# Patient Record
Sex: Male | Born: 1935 | Race: White | Hispanic: No | Marital: Single | Smoking: Former smoker
Health system: Southern US, Community
[De-identification: ages and names within clinical notes are randomized; demographics above are authoritative.]

## PROBLEM LIST (undated history)

## (undated) DIAGNOSIS — N400 Enlarged prostate without lower urinary tract symptoms: Secondary | ICD-10-CM

## (undated) HISTORY — DX: Benign prostatic hyperplasia without lower urinary tract symptoms: N40.0

---

## 2008-06-18 ENCOUNTER — Ambulatory Visit: Payer: Self-pay | Admitting: Internal Medicine

## 2008-06-18 DIAGNOSIS — J82 Pulmonary eosinophilia, not elsewhere classified: Secondary | ICD-10-CM

## 2008-06-18 DIAGNOSIS — J8289 Other pulmonary eosinophilia, not elsewhere classified: Secondary | ICD-10-CM

## 2008-06-18 HISTORY — DX: Other pulmonary eosinophilia, not elsewhere classified: J82.89

## 2008-06-19 LAB — CONVERTED CEMR LAB
Basophils Absolute: 0.1 10*3/uL (ref 0.0–0.1)
Basophils Relative: 1 % (ref 0.0–3.0)
Hemoglobin: 12.6 g/dL — ABNORMAL LOW (ref 13.0–17.0)
Lymphocytes Relative: 19 % (ref 12.0–46.0)
MCHC: 34.6 g/dL (ref 30.0–36.0)
Monocytes Relative: 8.3 % (ref 3.0–12.0)
Neutrophils Relative %: 57.6 % (ref 43.0–77.0)
RBC: 4.36 M/uL (ref 4.22–5.81)
Sed Rate: 48 mm/hr — ABNORMAL HIGH (ref 0–16)

## 2008-07-03 ENCOUNTER — Ambulatory Visit: Payer: Self-pay | Admitting: Internal Medicine

## 2009-12-30 IMAGING — CR DG CHEST 2V
3 series · 3 of 3 positions shown · non-contrast
Comparison: None

CLINICAL DATA: Pneumonia, follow-up, former smoker

CHEST - 2 VIEW

[view not recorded (1 of 3)]
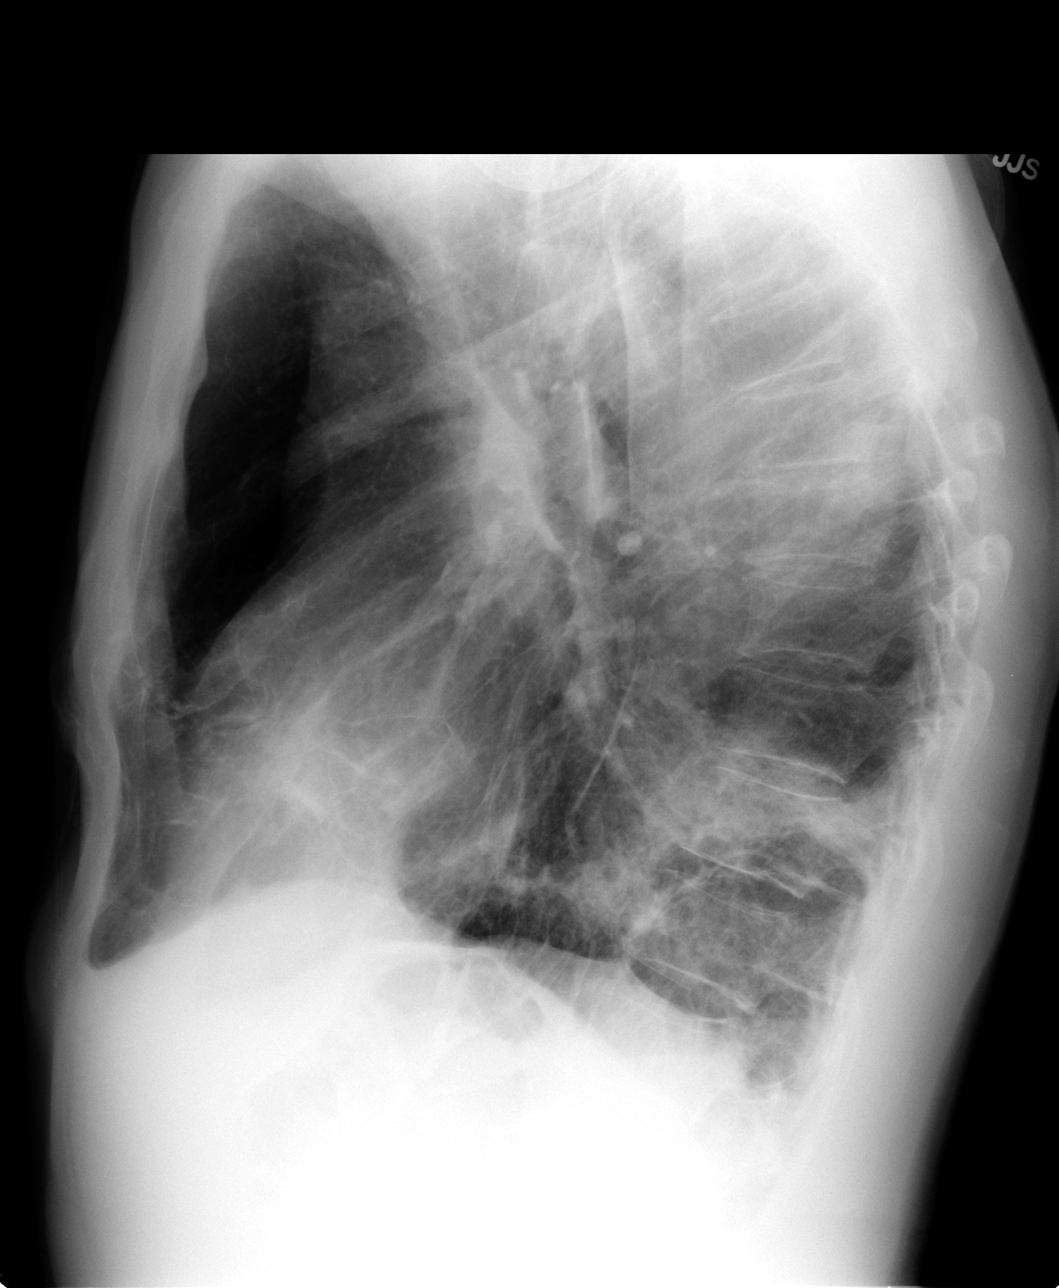

[view not recorded (2 of 3)]
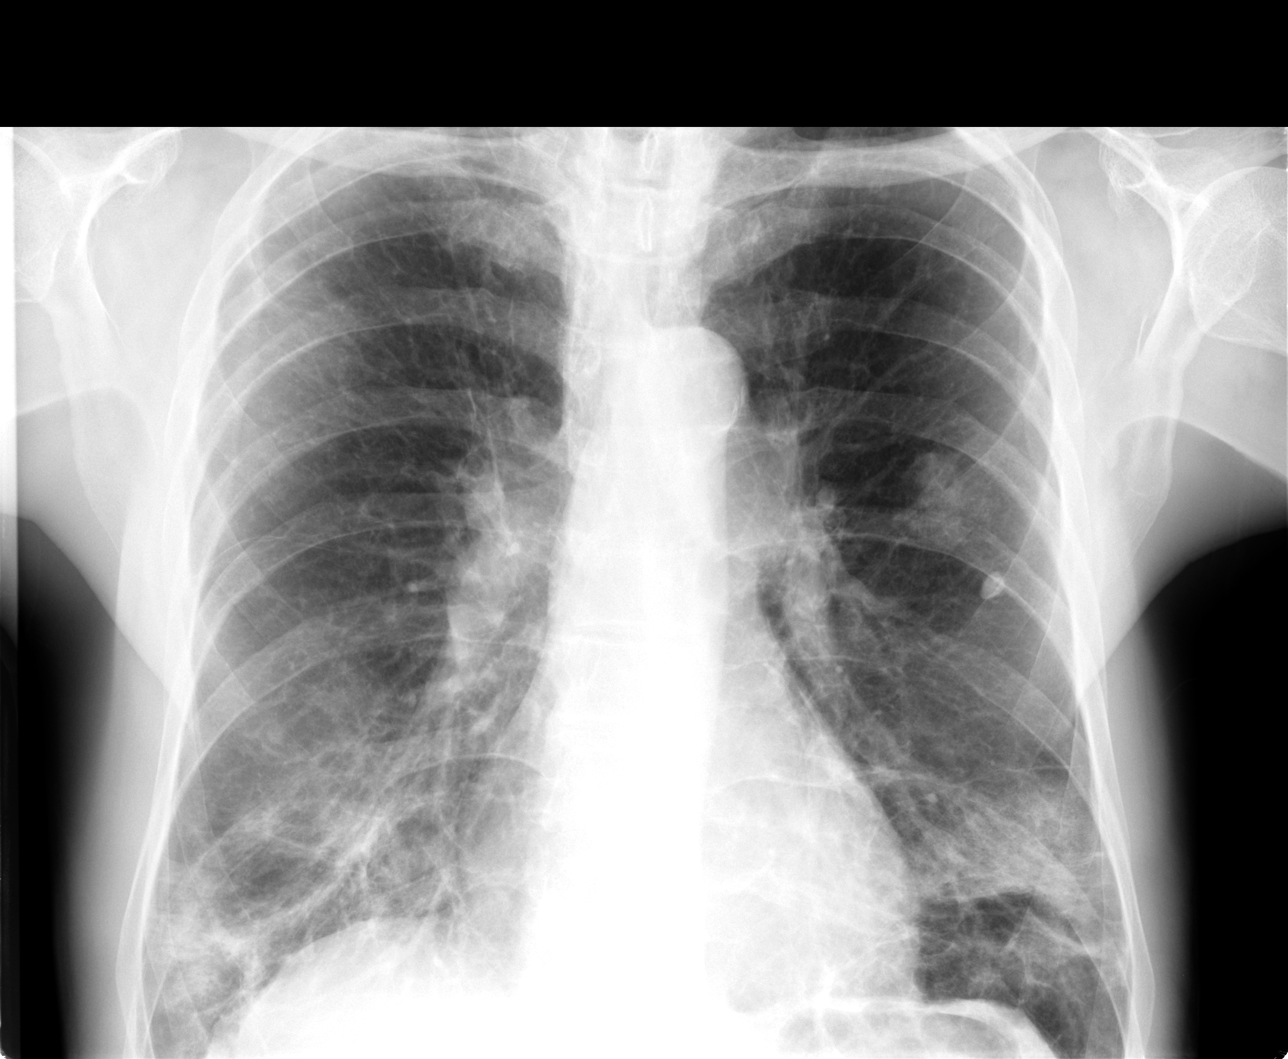

[view not recorded (3 of 3)]
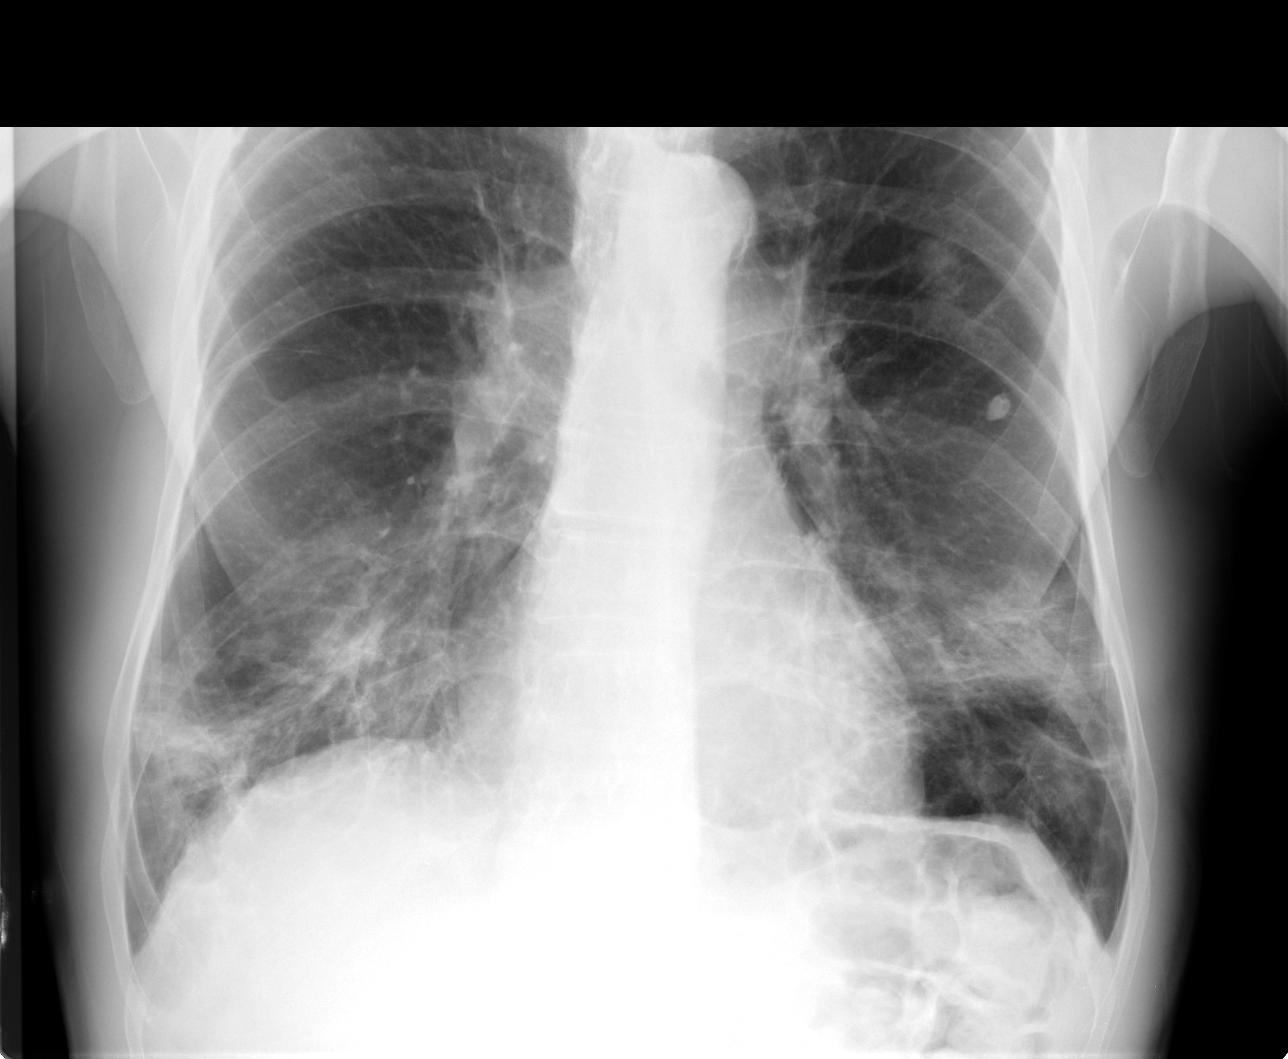

[3 of 3 positions shown; findings below may reference images not displayed]

FINDINGS: The lungs are hyperaerated consistent with COPD.  There
are patchy opacities at both lung bases involving the lower lobe,
right mid lobe and possibly lingula consistent with pneumonia.
Patchy opacity is also noted in the superior segment of the left
lower lobe most consistent with pneumonia.  Follow-up chest x-ray
is recommended to ensure clearing.  No effusion is seen.  The heart
is within normal limits in size.  Left lower lobe granuloma is
noted.
IMPRESSION: 1.  Patchy opacities particularly in the lower lobes but also in
the right middle lobe and/or lingular consistent with pneumonia.
2. COPD.

## 2011-09-22 DIAGNOSIS — I679 Cerebrovascular disease, unspecified: Secondary | ICD-10-CM | POA: Diagnosis not present

## 2011-09-22 DIAGNOSIS — I69998 Other sequelae following unspecified cerebrovascular disease: Secondary | ICD-10-CM | POA: Diagnosis not present

## 2011-09-22 DIAGNOSIS — I6990 Unspecified sequelae of unspecified cerebrovascular disease: Secondary | ICD-10-CM | POA: Diagnosis not present

## 2012-09-02 DIAGNOSIS — M5430 Sciatica, unspecified side: Secondary | ICD-10-CM | POA: Diagnosis not present

## 2014-02-24 DIAGNOSIS — C44529 Squamous cell carcinoma of skin of other part of trunk: Secondary | ICD-10-CM | POA: Diagnosis not present

## 2014-02-24 DIAGNOSIS — D0460 Carcinoma in situ of skin of unspecified upper limb, including shoulder: Secondary | ICD-10-CM | POA: Diagnosis not present

## 2014-02-24 DIAGNOSIS — L570 Actinic keratosis: Secondary | ICD-10-CM | POA: Diagnosis not present

## 2014-03-05 DIAGNOSIS — D0462 Carcinoma in situ of skin of left upper limb, including shoulder: Secondary | ICD-10-CM | POA: Diagnosis not present

## 2014-10-27 DIAGNOSIS — L570 Actinic keratosis: Secondary | ICD-10-CM | POA: Diagnosis not present

## 2015-02-23 DIAGNOSIS — M5410 Radiculopathy, site unspecified: Secondary | ICD-10-CM | POA: Diagnosis not present

## 2015-02-23 DIAGNOSIS — M791 Myalgia: Secondary | ICD-10-CM | POA: Diagnosis not present

## 2015-03-11 DIAGNOSIS — Z2821 Immunization not carried out because of patient refusal: Secondary | ICD-10-CM | POA: Diagnosis not present

## 2015-03-11 DIAGNOSIS — M25551 Pain in right hip: Secondary | ICD-10-CM | POA: Diagnosis not present

## 2015-03-13 DIAGNOSIS — M5416 Radiculopathy, lumbar region: Secondary | ICD-10-CM | POA: Diagnosis not present

## 2015-03-13 DIAGNOSIS — M5137 Other intervertebral disc degeneration, lumbosacral region: Secondary | ICD-10-CM | POA: Diagnosis not present

## 2015-03-13 DIAGNOSIS — M545 Low back pain: Secondary | ICD-10-CM | POA: Diagnosis not present

## 2015-03-31 DIAGNOSIS — M25552 Pain in left hip: Secondary | ICD-10-CM | POA: Diagnosis not present

## 2015-03-31 DIAGNOSIS — M5432 Sciatica, left side: Secondary | ICD-10-CM | POA: Diagnosis not present

## 2015-03-31 DIAGNOSIS — M5417 Radiculopathy, lumbosacral region: Secondary | ICD-10-CM | POA: Diagnosis not present

## 2015-04-02 DIAGNOSIS — M5417 Radiculopathy, lumbosacral region: Secondary | ICD-10-CM | POA: Diagnosis not present

## 2015-04-02 DIAGNOSIS — M5432 Sciatica, left side: Secondary | ICD-10-CM | POA: Diagnosis not present

## 2015-04-02 DIAGNOSIS — M25552 Pain in left hip: Secondary | ICD-10-CM | POA: Diagnosis not present

## 2015-04-05 DIAGNOSIS — M25552 Pain in left hip: Secondary | ICD-10-CM | POA: Diagnosis not present

## 2015-04-05 DIAGNOSIS — M5417 Radiculopathy, lumbosacral region: Secondary | ICD-10-CM | POA: Diagnosis not present

## 2015-04-05 DIAGNOSIS — M5432 Sciatica, left side: Secondary | ICD-10-CM | POA: Diagnosis not present

## 2015-04-16 DIAGNOSIS — M5136 Other intervertebral disc degeneration, lumbar region: Secondary | ICD-10-CM | POA: Diagnosis not present

## 2015-04-16 DIAGNOSIS — M5416 Radiculopathy, lumbar region: Secondary | ICD-10-CM | POA: Diagnosis not present

## 2015-04-16 DIAGNOSIS — M5117 Intervertebral disc disorders with radiculopathy, lumbosacral region: Secondary | ICD-10-CM | POA: Diagnosis not present

## 2016-04-29 DIAGNOSIS — J209 Acute bronchitis, unspecified: Secondary | ICD-10-CM | POA: Diagnosis not present

## 2016-04-29 DIAGNOSIS — J069 Acute upper respiratory infection, unspecified: Secondary | ICD-10-CM | POA: Diagnosis not present

## 2016-05-05 DIAGNOSIS — J22 Unspecified acute lower respiratory infection: Secondary | ICD-10-CM | POA: Diagnosis not present

## 2016-09-11 DIAGNOSIS — E785 Hyperlipidemia, unspecified: Secondary | ICD-10-CM | POA: Diagnosis not present

## 2016-09-11 DIAGNOSIS — Z1389 Encounter for screening for other disorder: Secondary | ICD-10-CM | POA: Diagnosis not present

## 2016-09-11 DIAGNOSIS — Z6825 Body mass index (BMI) 25.0-25.9, adult: Secondary | ICD-10-CM | POA: Diagnosis not present

## 2016-09-11 DIAGNOSIS — Z9181 History of falling: Secondary | ICD-10-CM | POA: Diagnosis not present

## 2016-09-11 DIAGNOSIS — R635 Abnormal weight gain: Secondary | ICD-10-CM | POA: Diagnosis not present

## 2016-09-19 DIAGNOSIS — D539 Nutritional anemia, unspecified: Secondary | ICD-10-CM | POA: Diagnosis not present

## 2016-09-19 DIAGNOSIS — D649 Anemia, unspecified: Secondary | ICD-10-CM | POA: Diagnosis not present

## 2017-07-25 DIAGNOSIS — L820 Inflamed seborrheic keratosis: Secondary | ICD-10-CM | POA: Diagnosis not present

## 2017-07-25 DIAGNOSIS — C44622 Squamous cell carcinoma of skin of right upper limb, including shoulder: Secondary | ICD-10-CM | POA: Diagnosis not present

## 2017-07-25 DIAGNOSIS — L821 Other seborrheic keratosis: Secondary | ICD-10-CM | POA: Diagnosis not present

## 2017-07-25 DIAGNOSIS — L578 Other skin changes due to chronic exposure to nonionizing radiation: Secondary | ICD-10-CM | POA: Diagnosis not present

## 2017-07-25 DIAGNOSIS — L570 Actinic keratosis: Secondary | ICD-10-CM | POA: Diagnosis not present

## 2017-08-07 DIAGNOSIS — C44612 Basal cell carcinoma of skin of right upper limb, including shoulder: Secondary | ICD-10-CM | POA: Diagnosis not present

## 2018-02-03 DEATH — deceased

## 2019-09-05 DIAGNOSIS — Z23 Encounter for immunization: Secondary | ICD-10-CM | POA: Diagnosis not present

## 2019-10-03 DIAGNOSIS — Z23 Encounter for immunization: Secondary | ICD-10-CM | POA: Diagnosis not present

## 2019-12-24 DIAGNOSIS — B029 Zoster without complications: Secondary | ICD-10-CM | POA: Diagnosis not present

## 2020-01-02 DIAGNOSIS — R21 Rash and other nonspecific skin eruption: Secondary | ICD-10-CM | POA: Diagnosis not present

## 2020-01-02 DIAGNOSIS — L209 Atopic dermatitis, unspecified: Secondary | ICD-10-CM | POA: Diagnosis not present

## 2020-01-02 DIAGNOSIS — T7840XA Allergy, unspecified, initial encounter: Secondary | ICD-10-CM | POA: Diagnosis not present

## 2020-01-06 DIAGNOSIS — N183 Chronic kidney disease, stage 3 unspecified: Secondary | ICD-10-CM | POA: Diagnosis not present

## 2020-01-06 DIAGNOSIS — B029 Zoster without complications: Secondary | ICD-10-CM | POA: Diagnosis not present

## 2020-01-06 DIAGNOSIS — T7840XD Allergy, unspecified, subsequent encounter: Secondary | ICD-10-CM | POA: Diagnosis not present

## 2020-01-06 DIAGNOSIS — Z1331 Encounter for screening for depression: Secondary | ICD-10-CM | POA: Diagnosis not present

## 2020-01-29 DIAGNOSIS — N183 Chronic kidney disease, stage 3 unspecified: Secondary | ICD-10-CM | POA: Diagnosis not present

## 2020-01-29 DIAGNOSIS — Z13220 Encounter for screening for lipoid disorders: Secondary | ICD-10-CM | POA: Diagnosis not present

## 2020-01-29 DIAGNOSIS — Z79899 Other long term (current) drug therapy: Secondary | ICD-10-CM | POA: Diagnosis not present

## 2020-01-29 DIAGNOSIS — Z0000 Encounter for general adult medical examination without abnormal findings: Secondary | ICD-10-CM | POA: Diagnosis not present

## 2020-01-29 DIAGNOSIS — B0229 Other postherpetic nervous system involvement: Secondary | ICD-10-CM | POA: Diagnosis not present

## 2020-01-29 DIAGNOSIS — E785 Hyperlipidemia, unspecified: Secondary | ICD-10-CM | POA: Diagnosis not present

## 2020-02-19 DIAGNOSIS — L578 Other skin changes due to chronic exposure to nonionizing radiation: Secondary | ICD-10-CM | POA: Diagnosis not present

## 2020-02-19 DIAGNOSIS — L821 Other seborrheic keratosis: Secondary | ICD-10-CM | POA: Diagnosis not present

## 2020-02-19 DIAGNOSIS — L570 Actinic keratosis: Secondary | ICD-10-CM | POA: Diagnosis not present

## 2020-03-24 DIAGNOSIS — B0229 Other postherpetic nervous system involvement: Secondary | ICD-10-CM | POA: Diagnosis not present

## 2020-03-24 DIAGNOSIS — B029 Zoster without complications: Secondary | ICD-10-CM | POA: Diagnosis not present

## 2020-03-24 DIAGNOSIS — N183 Chronic kidney disease, stage 3 unspecified: Secondary | ICD-10-CM | POA: Diagnosis not present

## 2020-03-24 DIAGNOSIS — Z6824 Body mass index (BMI) 24.0-24.9, adult: Secondary | ICD-10-CM | POA: Diagnosis not present

## 2020-06-03 DIAGNOSIS — M542 Cervicalgia: Secondary | ICD-10-CM | POA: Diagnosis not present

## 2020-06-08 DIAGNOSIS — R0982 Postnasal drip: Secondary | ICD-10-CM | POA: Diagnosis not present

## 2020-06-08 DIAGNOSIS — J309 Allergic rhinitis, unspecified: Secondary | ICD-10-CM | POA: Diagnosis not present

## 2020-06-08 DIAGNOSIS — Z6824 Body mass index (BMI) 24.0-24.9, adult: Secondary | ICD-10-CM | POA: Diagnosis not present

## 2020-06-08 DIAGNOSIS — M542 Cervicalgia: Secondary | ICD-10-CM | POA: Diagnosis not present

## 2020-09-21 DIAGNOSIS — I4891 Unspecified atrial fibrillation: Secondary | ICD-10-CM | POA: Diagnosis not present

## 2020-09-21 DIAGNOSIS — Z20828 Contact with and (suspected) exposure to other viral communicable diseases: Secondary | ICD-10-CM | POA: Diagnosis not present

## 2020-09-21 DIAGNOSIS — R519 Headache, unspecified: Secondary | ICD-10-CM | POA: Diagnosis not present

## 2020-09-21 DIAGNOSIS — Z87891 Personal history of nicotine dependence: Secondary | ICD-10-CM | POA: Diagnosis not present

## 2020-09-21 DIAGNOSIS — J449 Chronic obstructive pulmonary disease, unspecified: Secondary | ICD-10-CM | POA: Diagnosis not present

## 2020-09-21 DIAGNOSIS — Z20822 Contact with and (suspected) exposure to covid-19: Secondary | ICD-10-CM | POA: Diagnosis not present

## 2020-09-21 DIAGNOSIS — R0602 Shortness of breath: Secondary | ICD-10-CM | POA: Diagnosis not present

## 2020-09-22 DIAGNOSIS — I4891 Unspecified atrial fibrillation: Secondary | ICD-10-CM | POA: Diagnosis not present

## 2020-10-07 ENCOUNTER — Encounter: Payer: Self-pay | Admitting: Cardiology

## 2020-10-07 ENCOUNTER — Encounter: Payer: Self-pay | Admitting: *Deleted

## 2020-10-07 DIAGNOSIS — N400 Enlarged prostate without lower urinary tract symptoms: Secondary | ICD-10-CM | POA: Insufficient documentation

## 2020-11-07 NOTE — Progress Notes (Signed)
Cardiology Office Note:    Date:  11/08/2020   ID:  Kenneth Bradley, DOB 01/20/1931, MRN [id number]  PCP:  Serita Grammes, MD  Cardiologist:  Shirlee More, MD   Referring MD: Myer Peer, MD  ASSESSMENT:    1. PAF (paroxysmal atrial fibrillation) ([place])   2. Chronic anticoagulation    PLAN:    In order of problems listed above:  1. He remains in atrial fibrillation resume rate suppressing medication once daily beta-blocker he has been off his calcium channel blocker initiate anticoagulant he will stop aspirin check echocardiogram for cardiomyopathy and reassess in a month and consider cardioversion if remains symptomatic 2. Stop aspirin initiate anticoagulant  Next appointment 1 month   Medication Adjustments/Labs and Tests Ordered: Current medicines are reviewed at length with the patient today.  Concerns regarding medicines are outlined above.  Orders Placed This Encounter  Procedures  . EKG 12-Lead  . ECHOCARDIOGRAM COMPLETE   Meds ordered this encounter  Medications  . rivaroxaban (XARELTO) 20 MG TABS tablet    Sig: Take 1 tablet (20 mg total) by mouth daily with supper.    Dispense:  90 tablet    Refill:  3  . metoprolol succinate (TOPROL-XL) 50 MG 24 hr tablet    Sig: Take 1 tablet (50 mg total) by mouth daily. Take with or immediately following a meal.    Dispense:  90 tablet    Refill:  3     No chief complaint on file.   History of Present Illness:    Kenneth Bradley is a 87 y.o. male who is being seen today for the evaluation of atrial fibrillation at the request of Myer Peer, MD.  He was seen [facility] 09/21/2020 for atrial fibrillation. Laboratories reviewed hemoglobin 12.8 platelet count 233,000 high-sensitivity troponin was low at 10 potassium 3.9 sodium 137 creatinine 1.09 GFR greater than 60 cc Chest x-ray showed COPD. EKG showed atrial fibrillation rate 121 bpm. He was treated with Cardizem for rate  control in the emergency room and given a prescription for oral Cardizem.  He was not anticoagulated.  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  He also served in [place]. Presently takes care of his wife who has dementia. Until a few years ago ran every day and did marathons. He remains very active and walks every day. Recently he has noticed his exercise tolerance is diminished and varies day by day and at times his heart rate is rapid. He is no longer taking a calcium channel blocker. He has no history of heart disease congenital rheumatic does not have edema shortness of breath chest pain or syncope.  We discussed his stroke risk and he wants to take an anticoagulant will like 1 a day medications and will put him on Xarelto.  Also will resume rate slowing beta-blocker Toprol Past Medical History:  Diagnosis Date  . BPH (benign prostatic hyperplasia)   . PULMONARY INFILTRATE INCLUDES (EOSINOPHILIA) 06/18/2008   Qualifier: Diagnosis of  By: Melvyn Novas MD, Christena Deem     Past Surgical History:  Procedure Laterality Date  . VENTRAL HERNIA REPAIR  1977    Current Medications: Current Meds  Medication Sig  . metoprolol succinate (TOPROL-XL) 50 MG 24 hr tablet Take 1 tablet (50 mg total) by mouth daily. Take with or immediately following a meal.  . rivaroxaban (XARELTO) 20 MG TABS tablet Take 1 tablet (20 mg total) by mouth daily  with supper.     Allergies:   Ciprofloxacin and Valacyclovir   Social History   Socioeconomic History  . Marital status: Single    Spouse name: Not on file  . Number of children: Not on file  . Years of education: Not on file  . Highest education level: Not on file  Occupational History  . Not on file  Tobacco Use  . Smoking status: Former Smoker    Types: Cigarettes  . Smokeless tobacco: Never Used  Substance and Sexual Activity  . Alcohol use: Yes    Comment: Rare, special occasion  . Drug use: Never  . Sexual activity: Not on file   Other Topics Concern  . Not on file  Social History Narrative  . Not on file   Social Determinants of Health   Financial Resource Strain: Not on file  Food Insecurity: Not on file  Transportation Needs: Not on file  Physical Activity: Not on file  Stress: Not on file  Social Connections: Not on file     Family History: The patient's family history includes Diabetes in his father; Heart Problems in his father.  ROS:   ROS Please see the history of present illness.     All other systems reviewed and are negative.  EKGs/Labs/Other Studies Reviewed:    The following studies were reviewed today:   EKG:  EKG is  ordered today.  The ekg ordered today is personally reviewed and demonstrates atrial fibrillation controlled ventricular range  Recent Labs: No results found for requested labs within last 8760 hours.  Recent Lipid Panel 01/29/2020: Cholesterol 206 LDL 126 triglycerides 64 HDL 68  Physical Exam:    VS:  BP 130/78 (BP Location: Right Arm, Patient Position: Sitting, Cuff Size: Normal)   Pulse (!) 106   Ht 6\' 4"  (1.93 m)   Wt 185 lb 12.8 oz (84.3 kg)   SpO2 99%   BMI 22.62 kg/m     Wt Readings from Last 3 Encounters:  11/08/20 185 lb 12.8 oz (84.3 kg)  09/21/20 181 lb (82.1 kg)     GEN: He appears younger than his age well nourished, well developed in no acute distress HEENT: Normal NECK: No JVD; No carotid bruits LYMPHATICS: No lymphadenopathy CARDIAC: Irregular rhythm variable first heart sound  no murmurs, rubs, gallops RESPIRATORY:  Clear to auscultation without rales, wheezing or rhonchi  ABDOMEN: Soft, non-tender, non-distended MUSCULOSKELETAL:  No edema; No deformity  SKIN: Warm and dry NEUROLOGIC:  Alert and oriented x 3 PSYCHIATRIC:  Normal affect     Signed, Shirlee More, MD  11/08/2020 2:00 PM    [place]

## 2020-11-08 ENCOUNTER — Encounter: Payer: Self-pay | Admitting: Cardiology

## 2020-11-08 ENCOUNTER — Other Ambulatory Visit: Payer: Self-pay

## 2020-11-08 ENCOUNTER — Ambulatory Visit (INDEPENDENT_AMBULATORY_CARE_PROVIDER_SITE_OTHER): Payer: Medicare Other | Admitting: Cardiology

## 2020-11-08 ENCOUNTER — Telehealth: Payer: Self-pay | Admitting: Cardiology

## 2020-11-08 VITALS — BP 130/78 | HR 106 | Ht 76.0 in | Wt 185.8 lb

## 2020-11-08 DIAGNOSIS — Z7901 Long term (current) use of anticoagulants: Secondary | ICD-10-CM | POA: Diagnosis not present

## 2020-11-08 DIAGNOSIS — I480 Paroxysmal atrial fibrillation: Secondary | ICD-10-CM | POA: Diagnosis not present

## 2020-11-08 MED ORDER — METOPROLOL SUCCINATE ER 50 MG PO TB24: 50.0000 mg | ORAL_TABLET | Freq: Every day | ORAL | 3 refills | Status: DC

## 2020-11-08 MED ORDER — RIVAROXABAN 20 MG PO TABS: 20.0000 mg | ORAL_TABLET | Freq: Every day | ORAL | 3 refills | Status: DC

## 2020-11-08 MED ORDER — DILTIAZEM HCL ER BEADS 180 MG PO CP24: 180.0000 mg | ORAL_CAPSULE | Freq: Every day | ORAL | 3 refills | Status: DC

## 2020-11-08 NOTE — Patient Instructions (Signed)
Medication Instructions:  Your physician has recommended you make the following change in your medication:  START: Xarelto 20 mg take one tablet by mouth daily.  START: TOPROL XL 50 mg take one tablet by mouth daily.  *If you need a refill on your cardiac medications before your next appointment, please call your pharmacy*   Lab Work: NOne If you have labs (blood work) drawn today and your tests are completely normal, you will receive your results only by: Marland Kitchen MyChart Message (if you have MyChart) OR . A paper copy in the mail If you have any lab test that is abnormal or we need to change your treatment, we will call you to review the results.   Testing/Procedures: Your physician has requested that you have an echocardiogram. Echocardiography is a painless test that uses sound waves to create images of your heart. It provides your doctor with information about the size and shape of your heart and how well your heart's chambers and valves are working. This procedure takes approximately one hour. There are no restrictions for this procedure.     Follow-Up: At [facility], you and your health needs are our priority.  As part of our continuing mission to provide you with exceptional heart care, we have created designated Provider Care Teams.  These Care Teams include your primary Cardiologist (physician) and Advanced Practice Providers (APPs -  Physician Assistants and Nurse Practitioners) who all work together to provide you with the care you need, when you need it.  We recommend signing up for the patient portal called "MyChart".  Sign up information is provided on this After Visit Summary.  MyChart is used to connect with patients for Virtual Visits (Telemedicine).  Patients are able to view lab/test results, encounter notes, upcoming appointments, etc.  Non-urgent messages can be sent to your provider as well.   To learn more about what you can do with MyChart, go to  [email or web address].    Your next appointment:   1 month(s)  The format for your next appointment:   In Person  Provider:   Shirlee More, MD   Other Instructions

## 2020-11-08 NOTE — Telephone Encounter (Signed)
    Pt c/o medication issue:  1. Name of Medication:   diltiazem (TIAZAC) 180 MG 24 hr capsule   2. How are you currently taking this medication (dosage and times per day)? Take 180 mg by mouth daily.  3. Are you having a reaction (difficulty breathing--STAT)?   4. What is your medication issue? Pt wanted to know if Dr. Bettina Gavia still wants him to be on Diltiazem. Also, [facility] pharmacy don't have [place], he wanted to resend prescription to to [facility] at [address], [place], [place] [address]. Also, he said if Dr. Bettina Gavia wants him to be on diltiazem to send prescription at [facility] as well

## 2020-11-08 NOTE — Telephone Encounter (Signed)
Refills sent in per request 

## 2020-11-09 ENCOUNTER — Telehealth: Payer: Self-pay | Admitting: Cardiology

## 2020-11-09 MED ORDER — DILTIAZEM HCL ER BEADS 180 MG PO CP24: 180.0000 mg | ORAL_CAPSULE | Freq: Every day | ORAL | 3 refills | Status: DC

## 2020-11-09 NOTE — Telephone Encounter (Signed)
Prescription sent in again per request

## 2020-11-09 NOTE — Telephone Encounter (Signed)
Pt c/o medication issue:  1. Name of Medication: diltiazem (TIAZAC) 180 MG 24 hr capsule  2. How are you currently taking this medication (dosage and times per day)?   3. Are you having a reaction (difficulty breathing--STAT)?   4. What is your medication issue? Patient said this medication was on his after visit summary from his appointment yesterday but was not sent to his pharmacy. If the patient still needs to take it, please send the RX to  [facility], [place] - [address] AT [place]

## 2020-11-10 ENCOUNTER — Other Ambulatory Visit: Payer: Self-pay

## 2020-11-10 MED ORDER — DILTIAZEM HCL ER BEADS 180 MG PO CP24: 180.0000 mg | ORAL_CAPSULE | Freq: Every day | ORAL | 3 refills | Status: DC

## 2020-11-10 MED ORDER — RIVAROXABAN 20 MG PO TABS: 20.0000 mg | ORAL_TABLET | Freq: Every day | ORAL | 1 refills | Status: DC

## 2020-11-10 MED ORDER — METOPROLOL SUCCINATE ER 50 MG PO TB24: 50.0000 mg | ORAL_TABLET | Freq: Every day | ORAL | 3 refills | Status: DC

## 2020-11-10 NOTE — Telephone Encounter (Signed)
45m, 84.3kg, scr 1.09 09/21/20, lovw/munley 11/08/20, ccr 56.9

## 2020-11-10 NOTE — Telephone Encounter (Signed)
Refill of Diltiazem 180 mg, Metoprolol Succinate 50 mg, Xarelto 20 mg sent to [facility].

## 2020-11-30 ENCOUNTER — Ambulatory Visit (INDEPENDENT_AMBULATORY_CARE_PROVIDER_SITE_OTHER): Payer: Medicare Other

## 2020-11-30 ENCOUNTER — Other Ambulatory Visit: Payer: Self-pay

## 2020-11-30 DIAGNOSIS — I480 Paroxysmal atrial fibrillation: Secondary | ICD-10-CM

## 2020-11-30 DIAGNOSIS — Z7901 Long term (current) use of anticoagulants: Secondary | ICD-10-CM | POA: Diagnosis not present

## 2020-11-30 LAB — ECHOCARDIOGRAM COMPLETE
Area-P 1/2: 2.58 cm2
Calc EF: 47.9 %
MV M vel: 5.15 m/s
MV Peak grad: 106.1 mmHg
Radius: 0.7 cm
S' Lateral: 4.6 cm
Single Plane A2C EF: 51.1 %
Single Plane A4C EF: 43.2 %

## 2020-11-30 NOTE — Progress Notes (Signed)
Complete echocardiogram performed.  Jimmy Koa Zoeller RDCS, RVT  

## 2020-12-01 ENCOUNTER — Telehealth: Payer: Self-pay

## 2020-12-01 NOTE — Telephone Encounter (Signed)
Tried calling patient. No answer and no voicemail set up for me to leave a message. 

## 2020-12-01 NOTE — Telephone Encounter (Signed)
-----   Message from Kenneth Priest, MD sent at 12/01/2020  7:57 AM EDT ----- Heart muscle function is mildly to moderately reduced normal ejection fraction is 55 to 60%.  This is responsible for the way that he feels no longer able to do vigorous physical activity  We can discuss at office follow-up I think we should attempt to get him out of atrial fibrillation back to regular heart rhythm after he has been anticoagulated.

## 2020-12-02 ENCOUNTER — Telehealth: Payer: Self-pay

## 2020-12-02 NOTE — Telephone Encounter (Signed)
Spoke with patient regarding results and recommendation.  Patient verbalizes understanding and is agreeable to plan of care. Advised patient to call back with any issues or concerns.  

## 2020-12-02 NOTE — Telephone Encounter (Signed)
-----   Message from Richardo Priest, MD sent at 12/01/2020  7:57 AM EDT ----- Heart muscle function is mildly to moderately reduced normal ejection fraction is 55 to 60%.  This is responsible for the way that he feels no longer able to do vigorous physical activity  We can discuss at office follow-up I think we should attempt to get him out of atrial fibrillation back to regular heart rhythm after he has been anticoagulated.

## 2020-12-13 DIAGNOSIS — B789 Strongyloidiasis, unspecified: Secondary | ICD-10-CM

## 2020-12-13 DIAGNOSIS — R898 Other abnormal findings in specimens from other organs, systems and tissues: Secondary | ICD-10-CM | POA: Insufficient documentation

## 2020-12-13 HISTORY — DX: Other abnormal findings in specimens from other organs, systems and tissues: R89.8

## 2020-12-13 HISTORY — DX: Strongyloidiasis, unspecified: B78.9

## 2020-12-14 ENCOUNTER — Ambulatory Visit (INDEPENDENT_AMBULATORY_CARE_PROVIDER_SITE_OTHER): Payer: Medicare Other | Admitting: Cardiology

## 2020-12-14 ENCOUNTER — Other Ambulatory Visit: Payer: Self-pay

## 2020-12-14 ENCOUNTER — Encounter: Payer: Self-pay | Admitting: Cardiology

## 2020-12-14 VITALS — BP 130/91 | HR 70 | Ht 76.0 in | Wt 184.8 lb

## 2020-12-14 DIAGNOSIS — Z7901 Long term (current) use of anticoagulants: Secondary | ICD-10-CM | POA: Diagnosis not present

## 2020-12-14 DIAGNOSIS — I480 Paroxysmal atrial fibrillation: Secondary | ICD-10-CM | POA: Diagnosis not present

## 2020-12-14 NOTE — Progress Notes (Signed)
Cardiology Office Note:    Date:  12/14/2020   ID:  Kenneth Bradley, DOB 12-May-1931, MRN [id number]  PCP:  Serita Grammes, MD  Cardiologist:  Shirlee More, MD    Referring MD: Serita Grammes, MD    ASSESSMENT:    1. PAF (paroxysmal atrial fibrillation) ([place])   2. Chronic anticoagulation    PLAN:    In order of problems listed above:  He is improved with rate control continue beta-blocker calcium channel blocker however with his ongoing symptoms and evidence of cardiomyopathy I think he is best served by cardioversion we will set up electively next week.  If ineffective we could put him on amiodarone to retry a second time. Continues anticoagulant without interruption we will also check BMP and a CBC routinely before cardioversion   Next appointment: 6 weeks   Medication Adjustments/Labs and Tests Ordered: Current medicines are reviewed at length with the patient today.  Concerns regarding medicines are outlined above.  No orders of the defined types were placed in this encounter.  No orders of the defined types were placed in this encounter.   Chief Complaint  Patient presents with   Follow-up   Atrial Fibrillation    History of Present Illness:    Kenneth Bradley is a 87 y.o. male with a hx of atrial fibrillation last seen 11/08/2020.  He was in persistent atrial fibrillation was initiated on anticoagulation with Xarelto and continue his calcium channel blocker addition of a beta-blocker for heart rate control and reassessment in 1 month regarding cardioversion.  Compliance with diet, lifestyle and medications: Yes  His son is present participates in evaluation decision making He is improved with heart rate control but still has diminished exercise tolerance some exertional shortness of breath and has evidence of cardiomyopathy. We discussed cardioversion they agree we will set up for next week at [facility] benefits risks and options detailed and he is  remained compliant with his anticoagulant.  He has had no bleeding complication He was seen [place] ED 09/21/2020 for atrial fibrillation. Laboratories reviewed hemoglobin 12.8 platelet count 233,000 high-sensitivity troponin was low at 10 potassium 3.9 sodium 137 creatinine 1.09 GFR greater than 60 cc Chest x-ray showed COPD. EKG showed atrial fibrillation rate 121 bpm. He was treated with Cardizem for rate control in the emergency room and given a prescription for oral Cardizem.  He was not anticoagulated.  An echocardiogram performed 11/30/2020 he has evidence of left ventricular dysfunction EF 40 to 45% with diffuse hypokinesia mild concentric LVH normal right ventricular size function and pulmonary artery pressure both atria were severely enlarged he had moderate mitral regurgitation and the ascending aorta was dilated moderately in the range of 40 mm. 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   metoprolol succinate (TOPROL-XL) 50 MG 24 hr tablet Take 1 tablet (50 mg total) by mouth daily. Take with or immediately following a meal.   rivaroxaban (XARELTO) 20 MG TABS tablet Take 1 tablet (20 mg total)  by mouth daily with supper.     Allergies:   Ciprofloxacin and Valacyclovir   Social History   Socioeconomic History   Marital status: Single    Spouse name: Not on file   Number of children: Not on file   Years of education: Not on file   Highest education level: Not on file  Occupational History   Not on  file  Tobacco Use   Smoking status: Former    Pack years: 0.00    Types: Cigarettes   Smokeless tobacco: Never  Substance and Sexual Activity   Alcohol use: Yes    Comment: Rare, special occasion   Drug use: Never   Sexual activity: Not on file  Other Topics Concern   Not on file  Social History Narrative   Not on file   Social Determinants of Health   Financial Resource Strain: Not on file  Food Insecurity: Not on file  Transportation Needs: Not on file  Physical Activity: Not on file  Stress: Not on file  Social Connections: Not on file     Family History: The patient's  family history includes Diabetes in his father; Heart Problems in his father. ROS:   Please see the history of present illness.    All other systems reviewed and are negative.  EKGs/Labs/Other Studies Reviewed:    The following studies were reviewed today: EKG today shows atrial fibrillation with a well-controlled ventricular rate of 70 bpm normal QRS morphology   Recent Labs: No results found for requested labs within last 8760 hours.  Recent Lipid Panel No results found for: CHOL, TRIG, HDL, CHOLHDL, VLDL, LDLCALC, LDLDIRECT  Physical Exam:    VS:  BP (!) 130/91   Pulse 70   Ht 6\' 4"  (1.93 m)   Wt 184 lb 12.8 oz (83.8 kg)   SpO2 97%   BMI 22.49 kg/m     Wt Readings from Last 3 Encounters:  12/14/20 184 lb 12.8 oz (83.8 kg)  11/08/20 185 lb 12.8 oz (84.3 kg)  09/21/20 181 lb (82.1 kg)     GEN:   Well nourished, well developed in no acute distress HEENT: Normal NECK: No JVD; No carotid bruits LYMPHATICS: No lymphadenopathy CARDIAC: Regular rhythm variable first heart sound RRR, no murmurs, rubs, gallops RESPIRATORY:  Clear to auscultation without rales, wheezing or rhonchi  ABDOMEN: Soft, non-tender, non-distended MUSCULOSKELETAL:  No edema; No deformity  SKIN: Warm and dry NEUROLOGIC:  Alert and oriented x 3 PSYCHIATRIC:  Normal affect    Signed, Shirlee More, MD   12/14/2020 3:10 PM    [facility]

## 2020-12-14 NOTE — Patient Instructions (Signed)
Medication Instructions:  Your physician recommends that you continue on your current medications as directed. Please refer to the Current Medication list given to you today.  *If you need a refill on your cardiac medications before your next appointment, please call your pharmacy*   Lab Work: Your physician recommends that you have a BMET and CBC today in the office.  If you have labs (blood work) drawn today and your tests are completely normal, you will receive your results only by: [place] (if you have MyChart) OR A paper copy in the mail If you have any lab test that is abnormal or we need to change your treatment, we will call you to review the results.   Testing/Procedures: Your physician has recommended that you have a Cardioversion (DCCV). Electrical Cardioversion uses a jolt of electricity to your heart either through paddles or wired patches attached to your chest. This is a controlled, usually prescheduled, procedure. Defibrillation is done under light anesthesia in the hospital, and you usually go home the day of the procedure. This is done to get your heart back into a normal rhythm. You are not awake for the procedure.   Diagnosis: atrial fibrillation  DATE AND TIME OF PROCEDURE: TBD  Please register at the Admitting Department at: TBD  DO NOT EAT OR DRINK ANYTHING after midnight prior to your procedure.  You should take your medications as usual with a sip of water.  If you are diabetic, DO NOT TAKE YOUR DIABETIC MEDICATIONS the morning OF THE PROCEDURE.  DO NOT STOP any blood thinners that you may be taking. Do not miss any doses of your Rivaroxaban (Xarelto). If you miss a ose please let us know as soon as possible because we will need to reschedule.  Have your blood drawn as intructed.  You will need someone with you to drive you home after the procedure.    Follow-Up: At [facility], you and your health needs are our priority.  As part of our  continuing mission to provide you with exceptional heart care, we have created designated Provider Care Teams.  These Care Teams include your primary Cardiologist (physician) and Advanced Practice Providers (APPs -  Physician Assistants and Nurse Practitioners) who all work together to provide you with the care you need, when you need it.  We recommend signing up for the patient portal called "MyChart".  Sign up information is provided on this After Visit Summary.  MyChart is used to connect with patients for Virtual Visits (Telemedicine).  Patients are able to view lab/test results, encounter notes, upcoming appointments, etc.  Non-urgent messages can be sent to your provider as well.   To learn more about what you can do with MyChart, go to [email or web address].    Your next appointment:   6 week(s)  The format for your next appointment:   In Person  Provider:   Shirlee More, MD   Other Instructions  Electrical Cardioversion Electrical cardioversion is the delivery of a jolt of electricity to restore a normal rhythm to the heart. A rhythm that is too fast or is not regular keeps the heart from pumping well. In this procedure, sticky patches or metal paddles are placed on the chest to deliver electricity to the heart from a device. This procedure may be done in an emergency if: There is low or no blood pressure as a result of the heart rhythm. Normal rhythm must be restored as fast as possible to protect the brain and heart from  further damage. It may save a life. This may also be a scheduled procedure for irregular or fast heart rhythms that are not immediately life-threatening. Tell a health care provider about: Any allergies you have. All medicines you are taking, including vitamins, herbs, eye drops, creams, and over-the-counter medicines. Any problems you or family members have had with anesthetic medicines. Any blood disorders you have. Any surgeries you have had. Any medical  conditions you have. Whether you are pregnant or may be pregnant. What are the risks? Generally, this is a safe procedure. However, problems may occur, including: Allergic reactions to medicines. A blood clot that breaks free and travels to other parts of your body. The possible return of an abnormal heart rhythm within hours or days after the procedure. Your heart stopping (cardiac arrest). This is rare. What happens before the procedure? Medicines Your health care provider may have you start taking: Blood-thinning medicines (anticoagulants) so your blood does not clot as easily. Medicines to help stabilize your heart rate and rhythm. Ask your health care provider about: Changing or stopping your regular medicines. This is especially important if you are taking diabetes medicines or blood thinners. Taking medicines such as aspirin and ibuprofen. These medicines can thin your blood. Do not take these medicines unless your health care provider tells you to take them. Taking over-the-counter medicines, vitamins, herbs, and supplements. General instructions Follow instructions from your health care provider about eating or drinking restrictions. Plan to have someone take you home from the hospital or clinic. If you will be going home right after the procedure, plan to have someone with you for 24 hours. Ask your health care provider what steps will be taken to help prevent infection. These may include washing your skin with a germ-killing soap. What happens during the procedure? An IV will be inserted into one of your veins. Sticky patches (electrodes) or metal paddles may be placed on your chest. You will be given a medicine to help you relax (sedative). An electrical shock will be delivered. The procedure may vary among health care providers and hospitals.    What can I expect after the procedure? Your blood pressure, heart rate, breathing rate, and blood oxygen level will be monitored  until you leave the hospital or clinic. Your heart rhythm will be watched to make sure it does not change. You may have some redness on the skin where the shocks were given. Follow these instructions at home: Do not drive for 24 hours if you were given a sedative during your procedure. Take over-the-counter and prescription medicines only as told by your health care provider. Ask your health care provider how to check your pulse. Check it often. Rest for 48 hours after the procedure or as told by your health care provider. Avoid or limit your caffeine use as told by your health care provider. Keep all follow-up visits as told by your health care provider. This is important. Contact a health care provider if: You feel like your heart is beating too quickly or your pulse is not regular. You have a serious muscle cramp that does not go away. Get help right away if: You have discomfort in your chest. You are dizzy or you feel faint. You have trouble breathing or you are short of breath. Your speech is slurred. You have trouble moving an arm or leg on one side of your body. Your fingers or toes turn cold or blue. Summary Electrical cardioversion is the delivery of a jolt of  electricity to restore a normal rhythm to the heart. This procedure may be done right away in an emergency or may be a scheduled procedure if the condition is not an emergency. Generally, this is a safe procedure. After the procedure, check your pulse often as told by your health care provider. This information is not intended to replace advice given to you by your health care provider. Make sure you discuss any questions you have with your health care provider. Document Revised: 12/23/2018 Document Reviewed: 12/23/2018 Elsevier Patient Education  [place].

## 2020-12-15 LAB — CBC WITH DIFFERENTIAL/PLATELET
Basophils Absolute: 0.1 10*3/uL (ref 0.0–0.2)
Basos: 1 %
EOS (ABSOLUTE): 0.3 10*3/uL (ref 0.0–0.4)
Eos: 3 %
Hematocrit: 39 % (ref 37.5–51.0)
Hemoglobin: 13.3 g/dL (ref 13.0–17.7)
Immature Grans (Abs): 0 10*3/uL (ref 0.0–0.1)
Immature Granulocytes: 0 %
Lymphocytes Absolute: 1.7 10*3/uL (ref 0.7–3.1)
Lymphs: 22 %
MCH: 29.6 pg (ref 26.6–33.0)
MCHC: 34.1 g/dL (ref 31.5–35.7)
MCV: 87 fL (ref 79–97)
Monocytes Absolute: 0.7 10*3/uL (ref 0.1–0.9)
Monocytes: 9 %
Neutrophils Absolute: 4.8 10*3/uL (ref 1.4–7.0)
Neutrophils: 65 %
Platelets: 252 10*3/uL (ref 150–450)
RBC: 4.5 x10E6/uL (ref 4.14–5.80)
RDW: 14.2 % (ref 11.6–15.4)
WBC: 7.5 10*3/uL (ref 3.4–10.8)

## 2020-12-15 LAB — BASIC METABOLIC PANEL
BUN/Creatinine Ratio: 17 (ref 10–24)
BUN: 18 mg/dL (ref 8–27)
CO2: 23 mmol/L (ref 20–29)
Calcium: 9.1 mg/dL (ref 8.6–10.2)
Chloride: 102 mmol/L (ref 96–106)
Creatinine, Ser: 1.05 mg/dL (ref 0.76–1.27)
Glucose: 79 mg/dL (ref 65–99)
Potassium: 4.5 mmol/L (ref 3.5–5.2)
Sodium: 138 mmol/L (ref 134–144)
eGFR: 69 mL/min/{1.73_m2} (ref >59)

## 2020-12-20 ENCOUNTER — Telehealth: Payer: Self-pay | Admitting: Cardiology

## 2020-12-20 NOTE — Telephone Encounter (Signed)
   Patient is calling because he would like to know when his cardioversion will be scheduled. He would like to do any day except a Monday. Please advise.

## 2020-12-20 NOTE — Telephone Encounter (Signed)
Tried calling patient. No answer and no voicemail set up for me to leave a message. 

## 2020-12-21 NOTE — Telephone Encounter (Signed)
Spoke to the patient just now and I let him know that we will call him one day early next week to get this scheduled as [facility] said they can only schedule these about a week in advance. He verbalizes understanding and states that he is open any day the first week in August except for Monday.    Encouraged patient to call back with any questions or concerns.

## 2020-12-24 NOTE — Telephone Encounter (Signed)
Pt is aware that he is scheduled for his cardioversion at [facility] 01/06/21 and that [facility] will call on 01/05/21 with time to arrive. Pt verbalized understanding and had no questions.

## 2021-01-04 ENCOUNTER — Telehealth: Payer: Self-pay | Admitting: Cardiology

## 2021-01-04 NOTE — Telephone Encounter (Signed)
Pt is calling he missed his appt for the cardio version he wants someone to reschedule him

## 2021-01-04 NOTE — Telephone Encounter (Signed)
Spoke to the patient just now and let him know that I got him rescheduled for Thursday at 8:30 am. He needs to be there at 7:30 am and he is aware of this. He states that he will be there.    Encouraged patient to call back with any questions or concerns.

## 2021-01-06 DIAGNOSIS — I480 Paroxysmal atrial fibrillation: Secondary | ICD-10-CM | POA: Diagnosis not present

## 2021-01-06 DIAGNOSIS — I4891 Unspecified atrial fibrillation: Secondary | ICD-10-CM | POA: Diagnosis not present

## 2021-01-06 DIAGNOSIS — J449 Chronic obstructive pulmonary disease, unspecified: Secondary | ICD-10-CM | POA: Diagnosis not present

## 2021-01-06 DIAGNOSIS — Z7901 Long term (current) use of anticoagulants: Secondary | ICD-10-CM | POA: Diagnosis not present

## 2021-01-06 DIAGNOSIS — Z87891 Personal history of nicotine dependence: Secondary | ICD-10-CM | POA: Diagnosis not present

## 2021-01-11 NOTE — Telephone Encounter (Signed)
Spoke to the patient again just now and Kenneth Bradley let me know that Kenneth Bradley is going to stop by the office today to drop off a list of his blood pressure readings. Kenneth Bradley would like for Dr. Bettina Gavia to look over them and advise if Kenneth Bradley needs to make any changes. I told him I would watch for these and Kenneth Bradley thanks me for the call back.    Encouraged patient to call back with any questions or concerns.

## 2021-01-11 NOTE — Telephone Encounter (Signed)
   Pt is requesting to speak with Lilia Pro again

## 2021-01-23 NOTE — Progress Notes (Signed)
Cardiology Office Note:    Date:  01/24/2021   ID:  Kenneth Bradley, DOB 27-Oct-1930, MRN [id number]  PCP:  Serita Grammes, MD  Cardiologist:  Shirlee More, MD    Referring MD: Serita Grammes, MD    ASSESSMENT:    1. PAF (paroxysmal atrial fibrillation) ([place])   2. Chronic anticoagulation   3. Other cardiomyopathy ([place])    PLAN:    In order of problems listed above:  Unfortunately after successful cardioversion back in atrial fibrillation initiate oral amiodarone plan to reassess in the office regarding repeat cardioversion and continue his anticoagulant.  If unsuccessful refer to EP catheter ablation   Next appointment: 4 weeks after starting amiodarone   Medication Adjustments/Labs and Tests Ordered: Current medicines are reviewed at length with the patient today.  Concerns regarding medicines are outlined above.  No orders of the defined types were placed in this encounter.  No orders of the defined types were placed in this encounter.   Follow-up after cardioversion   History of Present Illness:    Kenneth Bradley is a 87 y.o. male with a hx of paroxysmal atrial fibrillation anticoagulated with Xarelto last seen 12/14/2020 and referred for cardioversion at [facility] 01/06/2021.  Echocardiogram performed 11/30/2020 showed EF of 40 to 45% with diffuse hypokinesia mild concentric LVH normal right ventricular size function and pulmonary artery pressure and both atria were severely enlarged there is also moderate enlargement ascending aorta 40 mm.  He recently was converted to sinus rhythm electively [phone] 2022 with 1 synchronized cardioversion shock 200 J.  EKG [place] [facility] steroids 06/08/2020 post cardioversion showed sinus bradycardia 54 bpm with APCs otherwise normal.  Compliance with diet, lifestyle and medications: Yes  Unfortunately he is back in atrial fibrillation. He is tolerant and compliant with his anticoagulant not having edema shortness of  breath chest pain palpitation or syncope. With cardiomyopathy I think he is best served by resuming sinus rhythm and after discussion of benefits risk we will start him on low-dose amiodarone he needs a week to have a ride from [facility] a week later check an EKG check baseline liver function thyroid and reassess in the office after 4 weeks of amiodarone regarding repeat cardioversion. 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   rivaroxaban (XARELTO) 20 MG TABS tablet Take 1 tablet (20 mg total) by mouth daily with supper.     Allergies:   Ciprofloxacin and Valacyclovir   Social History   Socioeconomic History   Marital status: Single    Spouse name: Not on file   Number of children: Not on file   Years of education: Not on file   Highest education level: Not on file  Occupational History   Not on file  Tobacco Use   Smoking status: Former    Types: Cigarettes   Smokeless tobacco: Never  Substance and Sexual Activity   Alcohol use: Yes    Comment: Rare, special occasion   Drug use: Never   Sexual activity: Not on file  Other Topics Concern   Not on file  Social History Narrative   Not on file   Social Determinants of Health   Financial Resource Strain: Not on file  Food Insecurity: Not on file  Transportation Needs: Not on file  Physical Activity: Not on file  Stress: Not on file  Social Connections: Not on file     Family History: The patient's atrial fibrillation 92 bpm family history includes Diabetes in his father;  Heart Problems in his father. ROS:   Please see the history of present illness.    All other systems reviewed and are negative.  EKGs/Labs/Other Studies Reviewed:    The following studies were reviewed today:  EKG:  EKG ordered today and personally reviewed.  The ekg ordered today demonstrates atrial fibrillation 92 bpm  Recent Labs: 12/14/2020: BUN 18; Creatinine, Ser 1.05; Hemoglobin 13.3; Platelets 252; Potassium 4.5; Sodium 138  Recent Lipid Panel No results found for: CHOL, TRIG, HDL, CHOLHDL, VLDL, LDLCALC, LDLDIRECT  Physical Exam:    VS:  BP (!) 130/94 (BP Location: Right Arm, Patient Position: Sitting, Cuff Size: Normal)   Pulse 92   Ht '6\' 4"'$  (1.93 m)   Wt 190 lb 3.2 oz (86.3 kg)   SpO2 99%   BMI 23.15 kg/m     Wt Readings from Last 3 Encounters:  01/24/21 190 lb 3.2 oz (86.3 kg)  12/14/20 184 lb 12.8 oz (83.8 kg)  11/08/20 185 lb 12.8 oz (84.3 kg)     GEN:  Well nourished, well developed in no acute distress HEENT: Normal NECK: No JVD; No carotid bruits LYMPHATICS: No lymphadenopathy CARDIAC: Regular rate and rhythm RRR, no murmurs, rubs, gallops RESPIRATORY:  Clear to auscultation without rales, wheezing or rhonchi  ABDOMEN: Soft, non-tender, non-distended MUSCULOSKELETAL:  No edema; No deformity  SKIN: Warm and dry NEUROLOGIC:  Alert and oriented x 3 PSYCHIATRIC:  Normal affect    Signed, Shirlee More, MD  01/24/2021 1:57 PM    [facility]

## 2021-01-24 ENCOUNTER — Other Ambulatory Visit: Payer: Self-pay

## 2021-01-24 ENCOUNTER — Encounter: Payer: Self-pay | Admitting: Cardiology

## 2021-01-24 ENCOUNTER — Ambulatory Visit (INDEPENDENT_AMBULATORY_CARE_PROVIDER_SITE_OTHER): Payer: Medicare Other | Admitting: Cardiology

## 2021-01-24 VITALS — BP 130/94 | HR 92 | Ht 76.0 in | Wt 190.2 lb

## 2021-01-24 DIAGNOSIS — I428 Other cardiomyopathies: Secondary | ICD-10-CM

## 2021-01-24 DIAGNOSIS — Z7901 Long term (current) use of anticoagulants: Secondary | ICD-10-CM

## 2021-01-24 DIAGNOSIS — I480 Paroxysmal atrial fibrillation: Secondary | ICD-10-CM | POA: Diagnosis not present

## 2021-01-24 MED ORDER — AMIODARONE HCL 200 MG PO TABS: 200.0000 mg | ORAL_TABLET | Freq: Two times a day (BID) | ORAL | 3 refills | Status: DC

## 2021-01-24 NOTE — Addendum Note (Signed)
Addended by: Orvan July on: 01/24/2021 02:09 PM   Modules accepted: Orders

## 2021-01-24 NOTE — Patient Instructions (Addendum)
Medication Instructions:  Your physician has recommended you make the following change in your medication:  START: Amiodarone 200 mg twice daily Nurse visit for EKG 1 week after starting Amiodarone.  *If you need a refill on your cardiac medications before your next appointment, please call your pharmacy*   Lab Work: Your physician recommends that you return for lab work in:  TODAY: CMP, TSH+T4,+T3 Free If you have labs (blood work) drawn today and your tests are completely normal, you will receive your results only by: [place] (if you have [place]) OR A paper copy in the mail If you have any lab test that is abnormal or we need to change your treatment, we will call you to review the results.   Testing/Procedures: None   Follow-Up: At [facility], you and your health needs are our priority.  As part of our continuing mission to provide you with exceptional heart care, we have created designated Provider Care Teams.  These Care Teams include your primary Cardiologist (physician) and Advanced Practice Providers (APPs -  Physician Assistants and Nurse Practitioners) who all work together to provide you with the care you need, when you need it.  We recommend signing up for the patient portal called "MyChart".  Sign up information is provided on this After Visit Summary.  MyChart is used to connect with patients for Virtual Visits (Telemedicine).  Patients are able to view lab/test results, encounter notes, upcoming appointments, etc.  Non-urgent messages can be sent to your provider as well.   To learn more about what you can do with MyChart, go to [email or web address].    Your next appointment:   5 week(s)  The format for your next appointment:   In Person  Provider:   Shirlee More, MD   Other Instructions

## 2021-01-25 LAB — COMPREHENSIVE METABOLIC PANEL
ALT: 14 IU/L (ref 0–44)
AST: 32 IU/L (ref 0–40)
Albumin/Globulin Ratio: 1.6 (ref 1.2–2.2)
Albumin: 4.2 g/dL (ref 3.6–4.6)
Alkaline Phosphatase: 61 IU/L (ref 44–121)
BUN/Creatinine Ratio: 13 (ref 10–24)
BUN: 15 mg/dL (ref 8–27)
Bilirubin Total: 0.5 mg/dL (ref 0.0–1.2)
CO2: 24 mmol/L (ref 20–29)
Calcium: 8.7 mg/dL (ref 8.6–10.2)
Chloride: 103 mmol/L (ref 96–106)
Creatinine, Ser: 1.13 mg/dL (ref 0.76–1.27)
Globulin, Total: 2.7 g/dL (ref 1.5–4.5)
Glucose: 77 mg/dL (ref 65–99)
Potassium: 4.4 mmol/L (ref 3.5–5.2)
Sodium: 141 mmol/L (ref 134–144)
Total Protein: 6.9 g/dL (ref 6.0–8.5)
eGFR: 63 mL/min/{1.73_m2} (ref >59)

## 2021-01-25 LAB — TSH+T4F+T3FREE
Free T4: 0.92 ng/dL (ref 0.82–1.77)
T3, Free: 2.3 pg/mL (ref 2.0–4.4)
TSH: 3.29 u[IU]/mL (ref 0.450–4.500)

## 2021-01-31 ENCOUNTER — Telehealth: Payer: Self-pay | Admitting: Cardiology

## 2021-01-31 NOTE — Telephone Encounter (Signed)
Spoke to the patient just now and he let me know that he was supposed to be scheduled for an EKG one week after starting his amiodarone. He started this on the 24th and I am adding him to the schedule for an EKG on Thursday per his request as Wednesday is not a good day for him.    Encouraged patient to call back with any questions or concerns.

## 2021-01-31 NOTE — Telephone Encounter (Signed)
Patient states he is wanting to come by the office for an EKG.

## 2021-02-03 ENCOUNTER — Other Ambulatory Visit: Payer: Self-pay

## 2021-02-03 ENCOUNTER — Ambulatory Visit (INDEPENDENT_AMBULATORY_CARE_PROVIDER_SITE_OTHER): Payer: Medicare Other

## 2021-02-03 VITALS — BP 146/92 | HR 72 | Ht 76.0 in | Wt 193.6 lb

## 2021-02-03 DIAGNOSIS — I480 Paroxysmal atrial fibrillation: Secondary | ICD-10-CM | POA: Diagnosis not present

## 2021-02-03 NOTE — Progress Notes (Signed)
Reason for visit: EKG  Name of MD requesting visit: Munley   H&P: atrial fib   ROS related to problem: atrial fib with recent medication change  Assessment and plan per MD: no change

## 2021-02-28 ENCOUNTER — Other Ambulatory Visit: Payer: Self-pay

## 2021-02-28 ENCOUNTER — Ambulatory Visit (INDEPENDENT_AMBULATORY_CARE_PROVIDER_SITE_OTHER): Payer: Medicare Other

## 2021-02-28 ENCOUNTER — Telehealth: Payer: Self-pay | Admitting: Cardiology

## 2021-02-28 VITALS — BP 136/72 | HR 44 | Ht 76.0 in | Wt 194.4 lb

## 2021-02-28 DIAGNOSIS — I480 Paroxysmal atrial fibrillation: Secondary | ICD-10-CM | POA: Diagnosis not present

## 2021-02-28 MED ORDER — AMIODARONE HCL 200 MG PO TABS: 200.0000 mg | ORAL_TABLET | Freq: Every day | ORAL | 3 refills | Status: DC

## 2021-02-28 NOTE — Telephone Encounter (Signed)
Spoke to the patient just now and he let me know that his heart rate was low this morning. He tells me that it normally runs in the 70's every day. He tells me that this morning his heart rate was 36 bpm. This was on his blood pressure machine. I had him take his blood pressure for me just now and it was 159/104 and his heart rate is 54 bpm. He has been up walking around and was trying to do his laundry when I called. The only symptom he is having is he states he feels "run down" like he does not have any energy.   I will route to Dr. Bettina Gavia to get his recommendation.

## 2021-02-28 NOTE — Telephone Encounter (Signed)
STAT if HR is under 50 or over 120 (normal HR is 60-100 beats per minute)  What is your heart rate? 46  Do you have a log of your heart rate readings (document readings)? 36 at 9:15 this morning 46 at 10:15 this morning  Do you have any other symptoms? PT WENT FOR A WALK THIS MORNING, PT FEELS "RUN DOWN"

## 2021-02-28 NOTE — Telephone Encounter (Signed)
No answer when called back. Will continue attempts.

## 2021-02-28 NOTE — Progress Notes (Signed)
Reason for visit: EKG  Name of MD requesting visit: Munley  H&P: atrial fib   ROS related to problem: atrial fib on metoprolol and amiodarone. Pt states his hr was in the 30's this am.  Assessment and plan per MD: [facility]

## 2021-02-28 NOTE — Telephone Encounter (Signed)
Spoke to the patient just now and got him scheduled for a nurse visit in [place] at 3 pm per Dr. Joya Gaskins recommendations.    Encouraged patient to call back with any questions or concerns.

## 2021-02-28 NOTE — Patient Instructions (Signed)
Medication Instructions:  Your physician has recommended you make the following change in your medication:  STOP: Diltiazem  STOP: Metoprolol  DECREASE: Amiodarone 200 mg take one tablet by mouth daily.  *If you need a refill on your cardiac medications before your next appointment, please call your pharmacy*   Lab Work: None If you have labs (blood work) drawn today and your tests are completely normal, you will receive your results only by: [place] (if you have MyChart) OR A paper copy in the mail If you have any lab test that is abnormal or we need to change your treatment, we will call you to review the results.   Testing/Procedures: None   Follow-Up: At [facility], you and your health needs are our priority.  As part of our continuing mission to provide you with exceptional heart care, we have created designated Provider Care Teams.  These Care Teams include your primary Cardiologist (physician) and Advanced Practice Providers (APPs -  Physician Assistants and Nurse Practitioners) who all work together to provide you with the care you need, when you need it.  We recommend signing up for the patient portal called "MyChart".  Sign up information is provided on this After Visit Summary.  MyChart is used to connect with patients for Virtual Visits (Telemedicine).  Patients are able to view lab/test results, encounter notes, upcoming appointments, etc.  Non-urgent messages can be sent to your provider as well.   To learn more about what you can do with MyChart, go to [email or web address].    Your next appointment:   As scheduled  The format for your next appointment:   In Person  Provider:   Shirlee More, MD   Other Instructions

## 2021-03-13 NOTE — Progress Notes (Signed)
Cardiology Office Note:    Date:  03/14/2021   ID:  Kenneth Bradley, DOB 08-03-1930, MRN [id number]  PCP:  Serita Grammes, MD  Cardiologist:  Shirlee More, MD    Referring MD: Serita Grammes, MD    ASSESSMENT:    1. PAF (paroxysmal atrial fibrillation) ([place])   2. On amiodarone therapy   3. Chronic anticoagulation   4. Other cardiomyopathy ([place])    PLAN:    In order of problems listed above:  Unfortunately despite resuming sinus rhythm he is drifted back into atrial flutter with controlled rate continue amiodarone 2 weeks and he still in atrial flutter outpatient cardioversion avoid rate slowing medications with symptomatic sinus bradycardia Continue amiodarone check liver function thyroid test CBC Reassess EF after resuming sinus rhythm regarding additional drug therapy mildly reduced EF   Next appointment: 6 weeks   Medication Adjustments/Labs and Tests Ordered: Current medicines are reviewed at length with the patient today.  Concerns regarding medicines are outlined above.  Orders Placed This Encounter  Procedures   Comprehensive metabolic panel   BPZ+[id number]   EKG 12-Lead    No orders of the defined types were placed in this encounter.   Chief Complaint  Patient presents with   Follow-up   Atrial Fibrillation     History of Present Illness:    Kenneth Bradley is a 87 y.o. male with a hx of recurrent paroxysmal atrial fibrillation initiated on amiodarone at his last office visit chronic anticoagulation mild left ventricular dysfunction with EF of 40 to 45% on echo 11/30/2020.  He was cardioverted to sinus rhythm 01/26/2021.  He was last seen 01/24/2021.  He called the office complains of weakness had an EKG 02/28/2021 showing resumption of sinus rhythm sinus bradycardia and is rate suppressing metoprolol was discontinued  Compliance with diet, lifestyle and medications: Yes  Within a few days he will probably develop recurrent atrial fibrillation  or flutter and he has been juggling the dose of his amiodarone between 206 100 mg daily think he would control his heart rate.  Last 2 weeks he generalized taking 2 tablets 400 mg daily.  Overall feels well he walks several miles a day he is vigorous and active no edema shortness of breath chest pain he is aware of his heart beating and no bleeding from his anticoagulant.  Today is in atrial flutter.  We discussed cardioversion he wants to give amiodarone little bit more time recheck an EKG in 2 weeks if he remains in flutter plan outpatient cardioversion.  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   rivaroxaban (XARELTO) 20 MG TABS tablet Take 1 tablet (20 mg total) by mouth daily with supper.     Allergies:   Ciprofloxacin and Valacyclovir   Social History   Socioeconomic History   Marital status: Single    Spouse name: Not on file   Number of children: Not on file   Years of education: Not on file   Highest education level:  Not on file  Occupational History   Not on file  Tobacco Use   Smoking status: Former    Types: Cigarettes   Smokeless tobacco: Never  Substance and Sexual Activity   Alcohol use: Yes    Comment: Rare, special occasion   Drug use: Never   Sexual activity: Not on file  Other Topics Concern   Not on file  Social History Narrative   Not on file   Social Determinants of Health   Financial Resource Strain: Not on file  Food Insecurity: Not on file  Transportation Needs:  Not on file  Physical Activity: Not on file  Stress: Not on file  Social Connections: Not on file     Family History: The patient's family history includes Diabetes in his father; Heart Problems in his father. ROS:   Please see the history of present illness.    All other systems reviewed and are negative.  EKGs/Labs/Other Studies Reviewed:    The following studies were reviewed today:  EKG:  EKG ordered today and personally reviewed.  The ekg ordered today demonstrates atrial flutter with controlled ventricular rate  Recent Labs: 12/14/2020: Hemoglobin 13.3; Platelets 252 01/24/2021: ALT 14; BUN 15; Creatinine, Ser 1.13; Potassium 4.4; Sodium 141; TSH 3.290  Recent Lipid Panel No results found for: CHOL, TRIG, HDL, CHOLHDL, VLDL, LDLCALC, LDLDIRECT  Physical Exam:    VS:  BP 140/80   Pulse 86   Ht 6\' 4"  (1.93 m)   Wt 191 lb (86.6 kg)   SpO2 98%   BMI 23.25 kg/m     Wt Readings from Last 3 Encounters:  03/14/21 191 lb (86.6 kg)  02/28/21 194 lb 6.4 oz (88.2 kg)  02/03/21 193 lb 9.6 oz (87.8 kg)     GEN: Looks younger than his age well nourished, well developed in no acute distress HEENT: Normal NECK: No JVD; No carotid bruits LYMPHATICS: No lymphadenopathy CARDIAC: Irregular rate and rhythm 140/80 140/80 no murmurs, rubs, gallops RESPIRATORY:  Clear to auscultation without rales, wheezing or rhonchi  ABDOMEN: Soft, non-tender, non-distended MUSCULOSKELETAL:  No edema; No deformity  SKIN: Warm and dry NEUROLOGIC:  Alert and oriented x 3 PSYCHIATRIC:  Normal affect    Signed, Shirlee More, MD  03/14/2021 1:44 PM    [facility]

## 2021-03-14 ENCOUNTER — Ambulatory Visit (INDEPENDENT_AMBULATORY_CARE_PROVIDER_SITE_OTHER): Payer: Medicare Other | Admitting: Cardiology

## 2021-03-14 ENCOUNTER — Encounter: Payer: Self-pay | Admitting: Cardiology

## 2021-03-14 ENCOUNTER — Other Ambulatory Visit: Payer: Self-pay

## 2021-03-14 VITALS — BP 140/80 | HR 86 | Ht 76.0 in | Wt 191.0 lb

## 2021-03-14 DIAGNOSIS — Z7901 Long term (current) use of anticoagulants: Secondary | ICD-10-CM | POA: Diagnosis not present

## 2021-03-14 DIAGNOSIS — I480 Paroxysmal atrial fibrillation: Secondary | ICD-10-CM

## 2021-03-14 DIAGNOSIS — Z79899 Other long term (current) drug therapy: Secondary | ICD-10-CM | POA: Diagnosis not present

## 2021-03-14 DIAGNOSIS — I428 Other cardiomyopathies: Secondary | ICD-10-CM

## 2021-03-14 NOTE — Patient Instructions (Signed)
Medication Instructions:  Your physician recommends that you continue on your current medications as directed. Please refer to the Current Medication list given to you today.  *If you need a refill on your cardiac medications before your next appointment, please call your pharmacy*   Lab Work: None If you have labs (blood work) drawn today and your tests are completely normal, you will receive your results only by: [place] (if you have MyChart) OR A paper copy in the mail If you have any lab test that is abnormal or we need to change your treatment, we will call you to review the results.   Testing/Procedures: None   Follow-Up: At [facility], you and your health needs are our priority.  As part of our continuing mission to provide you with exceptional heart care, we have created designated Provider Care Teams.  These Care Teams include your primary Cardiologist (physician) and Advanced Practice Providers (APPs -  Physician Assistants and Nurse Practitioners) who all work together to provide you with the care you need, when you need it.  We recommend signing up for the patient portal called "MyChart".  Sign up information is provided on this After Visit Summary.  MyChart is used to connect with patients for Virtual Visits (Telemedicine).  Patients are able to view lab/test results, encounter notes, upcoming appointments, etc.  Non-urgent messages can be sent to your provider as well.   To learn more about what you can do with MyChart, go to [email or web address].    Your next appointment:   6 week(s)  The format for your next appointment:   In Person  Provider:   Shirlee More, MD   Other Instructions

## 2021-03-15 ENCOUNTER — Telehealth: Payer: Self-pay

## 2021-03-15 LAB — COMPREHENSIVE METABOLIC PANEL WITH GFR
ALT: 18 IU/L (ref 0–44)
AST: 29 IU/L (ref 0–40)
Albumin/Globulin Ratio: 1.5 (ref 1.2–2.2)
Albumin: 4.2 g/dL (ref 3.6–4.6)
Alkaline Phosphatase: 66 IU/L (ref 44–121)
BUN/Creatinine Ratio: 10 (ref 10–24)
BUN: 12 mg/dL (ref 8–27)
Bilirubin Total: 0.5 mg/dL (ref 0.0–1.2)
CO2: 24 mmol/L (ref 20–29)
Calcium: 9.1 mg/dL (ref 8.6–10.2)
Chloride: 102 mmol/L (ref 96–106)
Creatinine, Ser: 1.16 mg/dL (ref 0.76–1.27)
Globulin, Total: 2.8 g/dL (ref 1.5–4.5)
Glucose: 93 mg/dL (ref 70–99)
Potassium: 4.6 mmol/L (ref 3.5–5.2)
Sodium: 139 mmol/L (ref 134–144)
Total Protein: 7 g/dL (ref 6.0–8.5)
eGFR: 61 mL/min/1.73

## 2021-03-15 LAB — TSH+T4F+T3FREE
Free T4: 1.4 ng/dL (ref 0.82–1.77)
T3, Free: 1.9 pg/mL — ABNORMAL LOW (ref 2.0–4.4)
TSH: 6.23 u[IU]/mL — ABNORMAL HIGH (ref 0.450–4.500)

## 2021-03-15 NOTE — Telephone Encounter (Signed)
-----   Message from Richardo Priest, MD sent at 03/15/2021  7:51 AM EDT ----- Stable results no changes we will recheck his thyroid subsequently and looks like he has mild changes from amiodarone does not need thyroid supplement at this time

## 2021-03-15 NOTE — Telephone Encounter (Signed)
Tried calling patient. No answer and no voicemail set up for me to leave a message. 

## 2021-03-28 ENCOUNTER — Ambulatory Visit (INDEPENDENT_AMBULATORY_CARE_PROVIDER_SITE_OTHER): Payer: Medicare Other

## 2021-03-28 VITALS — BP 158/96 | HR 86 | Resp 18 | Ht 76.0 in | Wt 191.2 lb

## 2021-03-28 DIAGNOSIS — I480 Paroxysmal atrial fibrillation: Secondary | ICD-10-CM | POA: Diagnosis not present

## 2021-03-28 DIAGNOSIS — I4891 Unspecified atrial fibrillation: Secondary | ICD-10-CM

## 2021-03-28 NOTE — Progress Notes (Signed)
Reason for visit: EKG  Name of MD requesting visit: Munley  H&P: atrial fib  ROS related to problem: Recent medication changes with atrial fib  Assessment and plan per MD: Munley no changes.

## 2021-04-18 ENCOUNTER — Encounter: Payer: Self-pay | Admitting: Cardiology

## 2021-04-18 ENCOUNTER — Other Ambulatory Visit: Payer: Self-pay

## 2021-04-18 ENCOUNTER — Ambulatory Visit (INDEPENDENT_AMBULATORY_CARE_PROVIDER_SITE_OTHER): Payer: Medicare Other | Admitting: Cardiology

## 2021-04-18 VITALS — BP 170/84 | HR 55 | Ht 76.0 in | Wt 189.0 lb

## 2021-04-18 DIAGNOSIS — I519 Heart disease, unspecified: Secondary | ICD-10-CM

## 2021-04-18 DIAGNOSIS — Z7901 Long term (current) use of anticoagulants: Secondary | ICD-10-CM

## 2021-04-18 DIAGNOSIS — I484 Atypical atrial flutter: Secondary | ICD-10-CM | POA: Diagnosis not present

## 2021-04-18 DIAGNOSIS — Z79899 Other long term (current) drug therapy: Secondary | ICD-10-CM

## 2021-04-18 DIAGNOSIS — I480 Paroxysmal atrial fibrillation: Secondary | ICD-10-CM

## 2021-04-18 MED ORDER — VALSARTAN 40 MG PO TABS: 40.0000 mg | ORAL_TABLET | Freq: Two times a day (BID) | ORAL | 3 refills | Status: DC

## 2021-04-18 NOTE — Progress Notes (Signed)
Cardiology Office Note:    Date:  04/18/2021   ID:  Kenneth Bradley, DOB Sep 08, 1930, MRN [id number]  PCP:  Serita Grammes, MD  Cardiologist:  Shirlee More, MD    Referring MD: Serita Grammes, MD    ASSESSMENT:    1. Atypical atrial flutter ([place])   2. On amiodarone therapy   3. Chronic anticoagulation   4. PAF (paroxysmal atrial fibrillation) ([place])   5. LV dysfunction    PLAN:    In order of problems listed above:  Fortunately he is back in sinus rhythm continue his amiodarone 2 weeks we will check labs including thyroid CMP blood pressure list brought to the office for renal function potassium I will see in 3 months and consider repeat echocardiogram around that time and continue his anticoagulation   Next appointment: 3 months   Medication Adjustments/Labs and Tests Ordered: Current medicines are reviewed at length with the patient today.  Concerns regarding medicines are outlined above.  No orders of the defined types were placed in this encounter.  No orders of the defined types were placed in this encounter.  Chief complaint: Cardioversion? History of Present Illness:    Kenneth Bradley is a 87 y.o. male with a hx of recurrent paroxysmal atrial fibrillation initiated on amiodarone mild LV dysfunction EF 40 to 45% on echocardiogram last seen 03/14/2021 after developing recurrent atrial fibrillation after spontaneously converting to sinus rhythm previously.  At his last visit he was in atrial flutter and we decided to wait 2 additional weeks before deciding on outpatient cardioversion.  He has subsequently contacted staff and said he may have missed a dose of Xarelto.. Compliance with diet, lifestyle and medications: For the last week he has felt much better and has noticed that his pulse is regular and indeed he is in sinus rhythm today on amiodarone. Is also noted his blood pressure is higher we will initiate antihypertensive therapy with a calcium channel  blocker valsartan. 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   rivaroxaban (XARELTO) 20 MG TABS tablet Take 1 tablet (20 mg total) by mouth daily with supper.     Allergies:   Ciprofloxacin and Valacyclovir   Social History   Socioeconomic History   Marital status: Single    Spouse name: Not on file   Number of children: Not on file   Years of education: Not on file   Highest education level: Not on file  Occupational History   Not on file  Tobacco Use   Smoking status: Former    Types: Cigarettes   Smokeless tobacco: Never  Substance and Sexual Activity   Alcohol use: Yes    Comment: Rare, special occasion   Drug use: Never   Sexual activity: Not on file  Other Topics Concern   Not on file  Social History Narrative   Not on file  Social Determinants of Health   Financial Resource Strain: Not on file  Food Insecurity: Not on file  Transportation Needs: Not on file  Physical Activity: Not on file  Stress: Not on file  Social Connections: Not on file     Family History: The patient's family history includes Diabetes in his father; Heart Problems in his father. ROS:   Please see the history of present illness.    All other systems reviewed and are negative.  EKGs/Labs/Other Studies Reviewed:    The following studies were reviewed  today:  EKG:  EKG ordered today and personally reviewed.  The ekg ordered today demonstrates sinus bradycardia 50 bpm  Recent Labs: 12/14/2020: Hemoglobin 13.3; Platelets 252 03/14/2021: ALT 18; BUN 12; Creatinine, Ser 1.16; Potassium 4.6; Sodium 139; TSH 6.230  Recent Lipid Panel No results found for: CHOL, TRIG, HDL, CHOLHDL, VLDL, LDLCALC, LDLDIRECT  Physical Exam:    VS:  BP (!) 170/84   Pulse (!) 55   Ht 6\' 4"  (1.93 m)   Wt 189 lb (85.7 kg)   SpO2 99%   BMI 23.01 kg/m     Wt Readings from Last 3 Encounters:  04/18/21 189 lb (85.7 kg)  03/28/21 191 lb 3.2 oz (86.7 kg)  03/14/21 191 lb (86.6 kg)     GEN:  Well nourished, well developed in no acute distress HEENT: Normal NECK: No JVD; No carotid bruits LYMPHATICS: No lymphadenopathy CARDIAC: RRR, no murmurs, rubs, gallops RESPIRATORY:  Clear to auscultation without rales, wheezing or rhonchi  ABDOMEN: Soft, non-tender, non-distended MUSCULOSKELETAL:  No edema; No deformity  SKIN: Warm and dry NEUROLOGIC:  Alert and oriented x 3 PSYCHIATRIC:  Normal affect    Signed, Shirlee More, MD  04/18/2021 2:19 PM    [facility]

## 2021-04-18 NOTE — Patient Instructions (Signed)
Medication Instructions:  Your physician has recommended you make the following change in your medication:  START: Valsartan 40 mg take one tablet by mouth twice daily.  *If you need a refill on your cardiac medications before your next appointment, please call your pharmacy*   Lab Work: Your physician recommends that you return for lab work in: 2 weeks BMP If you have labs (blood work) drawn today and your tests are completely normal, you will receive your results only by: [place] (if you have MyChart) OR A paper copy in the mail If you have any lab test that is abnormal or we need to change your treatment, we will call you to review the results.   Testing/Procedures: None   Follow-Up: At [facility], you and your health needs are our priority.  As part of our continuing mission to provide you with exceptional heart care, we have created designated Provider Care Teams.  These Care Teams include your primary Cardiologist (physician) and Advanced Practice Providers (APPs -  Physician Assistants and Nurse Practitioners) who all work together to provide you with the care you need, when you need it.  We recommend signing up for the patient portal called "MyChart".  Sign up information is provided on this After Visit Summary.  MyChart is used to connect with patients for Virtual Visits (Telemedicine).  Patients are able to view lab/test results, encounter notes, upcoming appointments, etc.  Non-urgent messages can be sent to your provider as well.   To learn more about what you can do with MyChart, go to [email or web address].    Your next appointment:   3 month(s)  The format for your next appointment:   In Person  Provider:   Shirlee More, MD    Other Instructions

## 2021-04-25 ENCOUNTER — Encounter (HOSPITAL_COMMUNITY): Payer: Self-pay

## 2021-04-25 ENCOUNTER — Ambulatory Visit (HOSPITAL_COMMUNITY): Admit: 2021-04-25 | Payer: Medicare Other | Admitting: Cardiology

## 2021-04-25 SURGERY — CARDIOVERSION
Anesthesia: General

## 2021-04-26 ENCOUNTER — Ambulatory Visit: Payer: Medicare Other | Admitting: Cardiology

## 2021-05-02 ENCOUNTER — Other Ambulatory Visit: Payer: Self-pay | Admitting: Cardiology

## 2021-05-02 NOTE — Telephone Encounter (Signed)
Prescription refill request for Xarelto received.  Indication:Afib Last office visit:11/22 Weight:85.7 kg Age:87 Scr:1.1 CrCl:56.27 ml/min  Prescription refilled

## 2021-05-03 ENCOUNTER — Telehealth: Payer: Self-pay | Admitting: Cardiology

## 2021-05-03 MED ORDER — AMIODARONE HCL 200 MG PO TABS
200.0000 mg | ORAL_TABLET | Freq: Two times a day (BID) | ORAL | 3 refills | Status: DC
Start: 2021-05-03 — End: 2021-05-03

## 2021-05-03 MED ORDER — AMIODARONE HCL 200 MG PO TABS: 200.0000 mg | ORAL_TABLET | Freq: Two times a day (BID) | ORAL | 0 refills | Status: DC

## 2021-05-03 NOTE — Telephone Encounter (Signed)
*  STAT* If patient is at the pharmacy, call can be transferred to refill team.   1. Which medications need to be refilled? (please list name of each medication and dose if known) amiodarone (PACERONE) 200 MG tablet  2. Which pharmacy/location (including street and city if local pharmacy) is medication to be sent to? [facility] [place], [address]  3. Do they need a 30 day or 90 day supply? 30 day supply only

## 2021-05-03 NOTE — Telephone Encounter (Signed)
Refill already addressed.

## 2021-05-09 ENCOUNTER — Other Ambulatory Visit: Payer: Self-pay | Admitting: *Deleted

## 2021-05-09 MED ORDER — AMIODARONE HCL 200 MG PO TABS: 200.0000 mg | ORAL_TABLET | Freq: Two times a day (BID) | ORAL | 3 refills | Status: DC

## 2021-05-09 NOTE — Telephone Encounter (Signed)
Rx refill sent to pharmacy. 

## 2021-05-10 ENCOUNTER — Telehealth: Payer: Self-pay | Admitting: Cardiology

## 2021-05-10 DIAGNOSIS — I4891 Unspecified atrial fibrillation: Secondary | ICD-10-CM | POA: Diagnosis not present

## 2021-05-10 MED ORDER — ENTRESTO 49-51 MG PO TABS: 1.0000 | ORAL_TABLET | Freq: Two times a day (BID) | ORAL | 3 refills | Status: DC

## 2021-05-10 MED ORDER — ENTRESTO 49-51 MG PO TABS: 1.0000 | ORAL_TABLET | Freq: Two times a day (BID) | ORAL | 0 refills | Status: DC

## 2021-05-10 NOTE — Telephone Encounter (Signed)
Recommendations reviewed with pt as per Dr. Munley's note.  Pt verbalized understanding and had no additional questions.  

## 2021-05-10 NOTE — Telephone Encounter (Signed)
Spoke with pt who states that the first week on Valsartan his BP improved. Pt states that the 2nd week his BP has been 180/90 over 11 times that he has checked it. Pt would like to know if his medication needs to be adjusted?

## 2021-05-10 NOTE — Addendum Note (Signed)
Addended by: Truddie Hidden on: 05/10/2021 11:42 AM   Modules accepted: Orders

## 2021-05-10 NOTE — Telephone Encounter (Signed)
Pt c/o medication issue:  1. Name of Medication:   valsartan (DIOVAN) 40 MG tablet    2. How are you currently taking this medication (dosage and times per day)? As directed  3. Are you having a reaction (difficulty breathing--STAT)? NO  4. What is your medication issue?  PT SAID THIS MEDICINE WAS STARTED TO ASSIST WITH BP LOW BUT HIS BP IS STAYING HIGH PT WANTS TO KNOW IF MUNLEY WOULD LIKE TO ADJUST MEDS AGAIN

## 2021-05-11 LAB — CBC
Hematocrit: 39.5 % (ref 37.5–51.0)
Hemoglobin: 13.2 g/dL (ref 13.0–17.7)
MCH: 30.1 pg (ref 26.6–33.0)
MCHC: 33.4 g/dL (ref 31.5–35.7)
MCV: 90 fL (ref 79–97)
Platelets: 280 10*3/uL (ref 150–450)
RBC: 4.39 x10E6/uL (ref 4.14–5.80)
RDW: 13.6 % (ref 11.6–15.4)
WBC: 6.9 10*3/uL (ref 3.4–10.8)

## 2021-05-11 LAB — BASIC METABOLIC PANEL
BUN/Creatinine Ratio: 13 (ref 10–24)
BUN: 19 mg/dL (ref 8–27)
CO2: 24 mmol/L (ref 20–29)
Calcium: 9.1 mg/dL (ref 8.6–10.2)
Chloride: 101 mmol/L (ref 96–106)
Creatinine, Ser: 1.44 mg/dL — ABNORMAL HIGH (ref 0.76–1.27)
Glucose: 87 mg/dL (ref 70–99)
Potassium: 4.7 mmol/L (ref 3.5–5.2)
Sodium: 138 mmol/L (ref 134–144)
eGFR: 47 mL/min/{1.73_m2} — ABNORMAL LOW (ref >59)

## 2021-05-12 ENCOUNTER — Telehealth: Payer: Self-pay

## 2021-05-12 NOTE — Telephone Encounter (Signed)
-----   Message from Richardo Priest, MD sent at 05/11/2021  9:39 AM EST ----- Normal or stable result  No changes

## 2021-05-12 NOTE — Telephone Encounter (Signed)
Patient is returning call.  °

## 2021-05-12 NOTE — Telephone Encounter (Signed)
Spoke with pt regarding his results and he states that his BP was good until 1400 on 12/7 and it increased to 190/105 and last pm it was 191/116. Pt states this am his BP is 158/95 HR 65 after walking 2 miles and he took his medication at 0630. How do you advise? We changed him from Valsartan to [facility] 05/10/21.

## 2021-05-12 NOTE — Telephone Encounter (Signed)
Recommendations reviewed with pt as per Dr. Munley's note.  Pt verbalized understanding and had no additional questions.  

## 2021-05-12 NOTE — Telephone Encounter (Signed)
Left message on patients voicemail to please return our call.   

## 2021-07-07 ENCOUNTER — Telehealth: Payer: Self-pay | Admitting: Cardiology

## 2021-07-07 NOTE — Telephone Encounter (Signed)
Spoke to the patient just now and let him know Dr. Munley's recommendations. He verbalizes understanding and thanks me for the call back.   Encouraged patient to call back with any questions or concerns.  

## 2021-07-07 NOTE — Telephone Encounter (Signed)
° °  Pt c/o medication issue:  1. Name of Medication:   sacubitril-valsartan (ENTRESTO) 49-51 MG    2. How are you currently taking this medication (dosage and times per day)? Take 1 tablet by mouth 2 (two) times daily.  3. Are you having a reaction (difficulty breathing--STAT)?   4. What is your medication issue? Pt said he cant tolerate this meds, he gets sob when taking this medication

## 2021-07-07 NOTE — Telephone Encounter (Signed)
Spoke with pt who states that after sitting for a while and he gets up to go into another room he has to stop and bend over to catch his breath.Pt also reports that his BP is staying elevated. 0600 140/80 took Entresto 49/51 at this time. 0845 164/101, 0900 177/109 and 0930 177/103. How do you advise?

## 2021-07-17 NOTE — Progress Notes (Signed)
Cardiology Office Note:    Date:  07/19/2021   ID:  Kenneth Bradley, DOB [birth year], MRN [id number]  PCP:  Serita Grammes, MD  Cardiologist:  Shirlee More, MD    Referring MD: Serita Grammes, MD    ASSESSMENT:    1. Dizzy spells   2. Weakness generalized   3. PAF (paroxysmal atrial fibrillation) ([place])   4. Chronic anticoagulation   5. Other cardiomyopathy ([place])    PLAN:    In order of problems listed above:  COVID uncertain what 1 is the underlying problem he is not orthostatic he is in sinus rhythm today but certainly possibilities include intermittent bradycardia and heart block on event monitor anemia taking anticoagulant check a CBC thyroid disease associated amiodarone check TSH T3-T4.  See back in the office 6 weeks I strongly encouraged him not to climb ladders or go on the road Continue his anticoagulant amiodarone   Medication Adjustments/Labs and Tests Ordered: Current medicines are reviewed at length with the patient today.  Concerns regarding medicines are outlined above.  No orders of the defined types were placed in this encounter.  No orders of the defined types were placed in this encounter.   Chief Complaint  Patient presents with   Follow-up   Shortness of Breath    History of Present Illness:    Kenneth Bradley is a 88 y.o. male with a hx of recurrent paroxysmal atrial fibrillation on amiodarone mild LV dysfunction EF 40 to 45% on echocardiogram last seen 04/18/2021 maintaining sinus rhythm on amiodarone.  On 07/07/2021 he called my office with complaints of shortness of breath and advised to be seen in the emergency room.  Compliance with diet, lifestyle and medications: Yes  He is really having several problems that intermittently feels weak he has had some numbness in his legs he has had episodes clearly orthostatic lightheaded and is now afraid to get on the roof or climb ladders.  He has had no obvious bleeding he takes an anticoagulant  he is at risk for thyroid disease with amiodarone Office he has no orthostatic shift in blood pressure.  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   sacubitril-valsartan (ENTRESTO) 49-51 MG Take 1 tablet by mouth 2 (two) times daily.   XARELTO 20 MG TABS tablet TAKE 1 TABLET DAILY WITH SUPPER     Allergies:   Other, Ciprofloxacin, and Valacyclovir   Social History   Socioeconomic History   Marital status: Single    Spouse name: Not on file   Number of children: Not on file   Years of education: Not on file   Highest education level: Not on file  Occupational History   Not on file  Tobacco Use   Smoking status: Former    Types: Cigarettes    Passive exposure: Past   Smokeless tobacco: Never  Substance and Sexual Activity   Alcohol use: Yes    Comment: Rare, special occasion  Drug use: Never   Sexual activity: Not on file  Other Topics Concern   Not on file  Social History Narrative   Not on file   Social Determinants of Health   Financial Resource Strain: Not on file  Food Insecurity: Not on file  Transportation Needs: Not on file  Physical Activity: Not on file  Stress: Not on file  Social Connections: Not on file     Family History: The patient's sinus rhythm 53 bpm otherwise normal EKG family history includes Diabetes in his father; Heart Problems in his father. ROS:   Please see the history of present illness.    All other systems reviewed and are  negative.  EKGs/Labs/Other Studies Reviewed:    The following studies were reviewed today:  EKG:  EKG ordered today and personally reviewed.  The ekg ordered today demonstrates sinus rhythm 53 beats per minute otherwise normal  Recent Labs: 03/14/2021: ALT 18; TSH 6.230 05/10/2021: BUN 19; Creatinine, Ser 1.44; Hemoglobin 13.2; Platelets 280; Potassium 4.7; Sodium 138  Recent Lipid Panel No results found for: CHOL, TRIG, HDL, CHOLHDL, VLDL, LDLCALC, LDLDIRECT  Physical Exam:    VS:  BP (!) 158/84 (BP Location: Right Arm)    Pulse (!) 53    Ht 6\' 4"  (1.93 m)    Wt 180 lb 12.8 oz (82 kg)    SpO2 98%    BMI 22.01 kg/m     Wt Readings from Last 3 Encounters:  07/19/21 180 lb 12.8 oz (82 kg)  04/18/21 189 lb (85.7 kg)  03/28/21 191 lb 3.2 oz (86.7 kg)    Blood pressure by me sitting 162/60 standing 152/70 GEN:  Well nourished, well developed in no acute distress he was a little tearful when I asked him about his wife. HEENT: Normal NECK: No JVD; No carotid bruits LYMPHATICS: No lymphadenopathy CARDIAC: RRR, no murmurs, rubs, gallops RESPIRATORY:  Clear to auscultation without rales, wheezing or rhonchi  ABDOMEN: Soft, non-tender, non-distended MUSCULOSKELETAL:  No edema; No deformity  SKIN: Warm and dry NEUROLOGIC:  Alert and oriented x 3 PSYCHIATRIC:  Normal affect    Signed, Shirlee More, MD  07/19/2021 2:53 PM    [place]

## 2021-07-19 ENCOUNTER — Other Ambulatory Visit: Payer: Self-pay

## 2021-07-19 ENCOUNTER — Encounter: Payer: Self-pay | Admitting: Cardiology

## 2021-07-19 ENCOUNTER — Ambulatory Visit (INDEPENDENT_AMBULATORY_CARE_PROVIDER_SITE_OTHER): Payer: Medicare Other

## 2021-07-19 ENCOUNTER — Ambulatory Visit (INDEPENDENT_AMBULATORY_CARE_PROVIDER_SITE_OTHER): Payer: Medicare Other | Admitting: Cardiology

## 2021-07-19 VITALS — BP 158/84 | HR 53 | Ht 76.0 in | Wt 180.8 lb

## 2021-07-19 DIAGNOSIS — I428 Other cardiomyopathies: Secondary | ICD-10-CM | POA: Diagnosis not present

## 2021-07-19 DIAGNOSIS — I480 Paroxysmal atrial fibrillation: Secondary | ICD-10-CM

## 2021-07-19 DIAGNOSIS — R531 Weakness: Secondary | ICD-10-CM

## 2021-07-19 DIAGNOSIS — R42 Dizziness and giddiness: Secondary | ICD-10-CM | POA: Diagnosis not present

## 2021-07-19 DIAGNOSIS — Z7901 Long term (current) use of anticoagulants: Secondary | ICD-10-CM

## 2021-07-19 NOTE — Addendum Note (Signed)
Addended by: Edwyna Shell I on: 07/19/2021 03:05 PM   Modules accepted: Orders

## 2021-07-19 NOTE — Patient Instructions (Signed)
Medication Instructions:  Your physician recommends that you continue on your current medications as directed. Please refer to the Current Medication list given to you today.  *If you need a refill on your cardiac medications before your next appointment, please call your pharmacy*   Lab Work: Your physician recommends that you return for lab work in: Labs today: CMP, CBC, TSH T3 T4 If you have labs (blood work) drawn today and your tests are completely normal, you will receive your results only by: MyChart Message (if you have [place]) OR A paper copy in the mail If you have any lab test that is abnormal or we need to change your treatment, we will call you to review the results.   Testing/Procedures: A zio monitor was ordered today. It will remain on for 7 days. You will then return monitor and event diary in provided box. It takes 1-2 weeks for report to be downloaded and returned to [place]. We will call you with the results. If monitor falls off or has orange flashing light, please call Zio for further instructions.     Follow-Up: At [facility], you and your health needs are our priority.  As part of our continuing mission to provide you with exceptional heart care, we have created designated Provider Care Teams.  These Care Teams include your primary Cardiologist (physician) and Advanced Practice Providers (APPs -  Physician Assistants and Nurse Practitioners) who all work together to provide you with the care you need, when you need it.  We recommend signing up for the patient portal called "MyChart".  Sign up information is provided on this After Visit Summary.  MyChart is used to connect with patients for Virtual Visits (Telemedicine).  Patients are able to view lab/test results, encounter notes, upcoming appointments, etc.  Non-urgent messages can be sent to your provider as well.   To learn more about what you can do with MyChart, go to [email or web address].    Your next  appointment:   6 week(s)  The format for your next appointment:   In Person  Provider:   Shirlee More, MD    Other Instructions None

## 2021-07-20 ENCOUNTER — Telehealth: Payer: Self-pay

## 2021-07-20 LAB — COMPREHENSIVE METABOLIC PANEL
ALT: 17 IU/L (ref 0–44)
AST: 30 IU/L (ref 0–40)
Albumin/Globulin Ratio: 1.1 — ABNORMAL LOW (ref 1.2–2.2)
Albumin: 3.9 g/dL (ref 3.6–4.6)
Alkaline Phosphatase: 75 IU/L (ref 44–121)
BUN/Creatinine Ratio: 13 (ref 10–24)
BUN: 17 mg/dL (ref 8–27)
Bilirubin Total: 0.4 mg/dL (ref 0.0–1.2)
CO2: 23 mmol/L (ref 20–29)
Calcium: 8.7 mg/dL (ref 8.6–10.2)
Chloride: 102 mmol/L (ref 96–106)
Creatinine, Ser: 1.35 mg/dL — ABNORMAL HIGH (ref 0.76–1.27)
Globulin, Total: 3.5 g/dL (ref 1.5–4.5)
Glucose: 83 mg/dL (ref 70–99)
Potassium: 4.6 mmol/L (ref 3.5–5.2)
Sodium: 139 mmol/L (ref 134–144)
Total Protein: 7.4 g/dL (ref 6.0–8.5)
eGFR: 50 mL/min/{1.73_m2} — ABNORMAL LOW (ref >59)

## 2021-07-20 LAB — CBC
Hematocrit: 35.4 % — ABNORMAL LOW (ref 37.5–51.0)
Hemoglobin: 12.3 g/dL — ABNORMAL LOW (ref 13.0–17.7)
MCH: 30.4 pg (ref 26.6–33.0)
MCHC: 34.7 g/dL (ref 31.5–35.7)
MCV: 87 fL (ref 79–97)
Platelets: 254 10*3/uL (ref 150–450)
RBC: 4.05 x10E6/uL — ABNORMAL LOW (ref 4.14–5.80)
RDW: 13.8 % (ref 11.6–15.4)
WBC: 5.4 10*3/uL (ref 3.4–10.8)

## 2021-07-20 LAB — TSH+T4F+T3FREE
Free T4: 0.73 ng/dL — ABNORMAL LOW (ref 0.82–1.77)
T3, Free: 1.9 pg/mL — ABNORMAL LOW (ref 2.0–4.4)
TSH: 26.8 u[IU]/mL — ABNORMAL HIGH (ref 0.450–4.500)

## 2021-07-20 MED ORDER — LEVOTHYROXINE SODIUM 150 MCG PO TABS: 150.0000 ug | ORAL_TABLET | Freq: Every day | ORAL | 3 refills | Status: DC

## 2021-07-20 NOTE — Telephone Encounter (Signed)
Spoke with patient regarding results and recommendation.  Patient verbalizes understanding and is agreeable to plan of care. Advised patient to call back with any issues or concerns.  

## 2021-07-20 NOTE — Telephone Encounter (Signed)
-----   Message from Richardo Priest, MD sent at 07/20/2021 12:22 PM EST ----- He is on amiodarone  Uncommonly the thyroid becomes underactive that can cause a great deal weakness and this is monitored along with  Start Synthroid 0.150 mg daily copy to his PCP and should follow-up with them in about a month to have thyroid studies checked.  He should feel better  I want him to stay off ladders and off the roof

## 2021-07-26 DIAGNOSIS — I428 Other cardiomyopathies: Secondary | ICD-10-CM

## 2021-07-26 DIAGNOSIS — R531 Weakness: Secondary | ICD-10-CM

## 2021-07-26 DIAGNOSIS — Z7901 Long term (current) use of anticoagulants: Secondary | ICD-10-CM

## 2021-07-26 DIAGNOSIS — R42 Dizziness and giddiness: Secondary | ICD-10-CM

## 2021-07-26 DIAGNOSIS — I480 Paroxysmal atrial fibrillation: Secondary | ICD-10-CM

## 2021-08-02 DIAGNOSIS — R531 Weakness: Secondary | ICD-10-CM | POA: Diagnosis not present

## 2021-08-02 DIAGNOSIS — R42 Dizziness and giddiness: Secondary | ICD-10-CM | POA: Diagnosis not present

## 2021-08-02 DIAGNOSIS — I480 Paroxysmal atrial fibrillation: Secondary | ICD-10-CM | POA: Diagnosis not present

## 2021-08-02 DIAGNOSIS — Z7901 Long term (current) use of anticoagulants: Secondary | ICD-10-CM | POA: Diagnosis not present

## 2021-09-09 ENCOUNTER — Ambulatory Visit (INDEPENDENT_AMBULATORY_CARE_PROVIDER_SITE_OTHER): Payer: Medicare Other | Admitting: Cardiology

## 2021-09-09 ENCOUNTER — Encounter: Payer: Self-pay | Admitting: Cardiology

## 2021-09-09 VITALS — BP 132/74 | HR 49 | Ht 76.0 in | Wt 180.0 lb

## 2021-09-09 DIAGNOSIS — E039 Hypothyroidism, unspecified: Secondary | ICD-10-CM

## 2021-09-09 DIAGNOSIS — Z79899 Other long term (current) drug therapy: Secondary | ICD-10-CM

## 2021-09-09 DIAGNOSIS — I428 Other cardiomyopathies: Secondary | ICD-10-CM

## 2021-09-09 DIAGNOSIS — I480 Paroxysmal atrial fibrillation: Secondary | ICD-10-CM | POA: Diagnosis not present

## 2021-09-09 DIAGNOSIS — Z7901 Long term (current) use of anticoagulants: Secondary | ICD-10-CM

## 2021-09-09 NOTE — Progress Notes (Signed)
?Cardiology Office Note:   ? ?Date:  09/09/2021  ? ?ID:  [name], DOB [birth year], MRN [id number] ? ?PCP:  Serita Grammes, MD  ?Cardiologist:  Shirlee More, MD   ? ?Referring MD: Serita Grammes, MD  ? ? ?ASSESSMENT:   ? ?1. PAF (paroxysmal atrial fibrillation) ([place])   ?2. On amiodarone therapy   ?3. Chronic anticoagulation   ?4. Other cardiomyopathy ([place])   ?5. Acquired hypothyroidism   ? ?PLAN:   ? ?In order of problems listed above: ? ?Kenneth Bradley is doing better maintaining sinus rhythm reduce his amiodarone to 100 mg daily and continue his anticoagulant ?Continue Entresto repeat echocardiogram around the time of his next visit ?Much improved initiating thyroid replacement at his request check TSH T3 and T4 ? ? ?Next appointment: 3 months ? ? ?Medication Adjustments/Labs and Tests Ordered: ?Current medicines are reviewed at length with the patient today.  Concerns regarding medicines are outlined above.  ?No orders of the defined types were placed in this encounter. ? ?No orders of the defined types were placed in this encounter. ? ? ?Chief Complaint  ?Patient presents with  ? Follow-up  ? Atrial Fibrillation  ? Anticoagulation  ? Cardiomyopathy  ? ? ?History of Present Illness:   ? ?Kenneth Bradley is a 88 y.o. male with a hx of paroxysmal atrial fibrillation maintaining sinus rhythm on amiodarone left ventricular dysfunction EF 40 to 45% last seen 07/19/2021 complaining of weakness and fatigue.  Testing showed the expected hypothyroidism with a TSH of 26.805/free T4 diminished 0.73 and free T3 diminished 1.9 was placed on a thyroid supplement.  An event monitor for 7 days reported 08/06/2021 showed no recurrence atrial fibrillation and flutter and moderate sinus bradycardia without pauses or AV block. ? ?Compliance with diet, lifestyle and medications: Yes ? ?Kenneth Bradley feels improved we will recheck his thyroid studies today. ?Monitor shows that he is maintaining sinus rhythm reduce his amiodarone dose  especially with his relative bradycardia ?Continue his Entresto. ?No edema shortness of breath chest pain palpitation or syncope no bleeding from his anticoagulant ?Past Medical History:  ?Diagnosis Date  ? BPH (benign prostatic hyperplasia)   ? Eosinophil count raised 12/13/2020  ? 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 PULMONARY INFILTRATE INCLUDES (EOSINOPHILIA) 06/18/2008  ? Qualifier: Diagnosis of  By: Melvyn Novas MD, Christena Deem   ? ? ?Past Surgical History:  ?Procedure Laterality Date  ? [place]  ? ? ?Current Medications: ?Current Meds  ?Medication Sig  ? amiodarone (PACERONE) 200 MG tablet Take 1 tablet (200 mg total) by mouth 2 (two) times daily.  ? levothyroxine (SYNTHROID) 150 MCG tablet Take 1 tablet (150 mcg total) by mouth daily before breakfast.  ? sacubitril-valsartan (ENTRESTO) 49-51 MG Take 1 tablet by mouth 2 (two) times daily.  ? XARELTO 20 MG TABS tablet TAKE 1 TABLET DAILY WITH SUPPER  ?  ? ?Allergies:   Other, Ciprofloxacin, and Valacyclovir  ? ?Social History  ? ?Socioeconomic History  ? Marital status: Single  ?  Spouse name: Not on file  ? Number of children: Not on file  ? Years of education: Not on file  ? Highest education level: Not on file  ?Occupational History  ? Not on file  ?Tobacco Use  ? Smoking status: Former  ?  Types: Cigarettes  ?  Passive exposure: Past  ? Smokeless tobacco: Never  ?Substance and Sexual Activity  ? Alcohol use: Yes  ?  Comment: Rare, special occasion  ?  Drug use: Never  ? Sexual activity: Not on file  ?Other Topics Concern  ? Not on file  ?Social History Narrative  ? Not on file  ? ?Social Determinants of Health  ? ?Financial Resource Strain: Not on file  ?Food Insecurity: Not on file  ?Transportation Needs: Not on file  ?Physical Activity: Not on file  ?Stress: Not on file  ?Social Connections: Not on file  ?  ? ?Family History: ?The patient's family history includes Diabetes in his father; Heart Problems in his father. ?ROS:   ?Please see the  history of present illness.    ?All other systems reviewed and are negative. ? ?EKGs/Labs/Other Studies Reviewed:   ? ?The following studies were reviewed today: ? ? ? ?Recent Labs: ?07/19/2021: ALT 17; BUN 17; Creatinine, Ser 1.35; Hemoglobin 12.3; Platelets 254; Potassium 4.6; Sodium 139; TSH 26.800  ?Recent Lipid Panel ?No results found for: CHOL, TRIG, HDL, CHOLHDL, VLDL, LDLCALC, LDLDIRECT ? ?Physical Exam:   ? ?VS:  BP 132/74 (BP Location: Right Arm)   Pulse (!) 49   Ht '6\' 4"'$  (1.93 m)   Wt 180 lb (81.6 kg)   SpO2 98%   BMI 21.91 kg/m?    ? ?Wt Readings from Last 3 Encounters:  ?09/09/21 180 lb (81.6 kg)  ?07/19/21 180 lb 12.8 oz (82 kg)  ?04/18/21 189 lb (85.7 kg)  ?  ? ?GEN: Looks markedly improved well nourished, well developed in no acute distress ?HEENT: Normal ?NECK: No JVD; No carotid bruits ?LYMPHATICS: No lymphadenopathy ?CARDIAC: RRR, no murmurs, rubs, gallops ?RESPIRATORY:  Clear to auscultation without rales, wheezing or rhonchi  ?ABDOMEN: Soft, non-tender, non-distended ?MUSCULOSKELETAL:  No edema; No deformity  ?SKIN: Warm and dry ?NEUROLOGIC:  Alert and oriented x 3 ?PSYCHIATRIC:  Normal affect  ? ? ?Signed, ?Shirlee More, MD  ?09/09/2021 3:13 PM    ?[place]  ?

## 2021-09-09 NOTE — Patient Instructions (Signed)
Medication Instructions:  ?Your physician has recommended you make the following change in your medication: ? Decrease Amiodarone to '200mg'$  once daily ? ?*If you need a refill on your cardiac medications before your next appointment, please call your pharmacy* ? ? ?Lab Work: ?Your physician recommends that you return for lab work in: Today for [place], TSH, T4, T3 Free ? ?If you have labs (blood work) drawn today and your tests are completely normal, you will receive your results only by: ?MyChart Message (if you have MyChart) OR ?A paper copy in the mail ?If you have any lab test that is abnormal or we need to change your treatment, we will call you to review the results. ? ? ?Testing/Procedures: ?NONE ? ? ?Follow-Up: ?At [facility], you and your health needs are our priority.  As part of our continuing mission to provide you with exceptional heart care, we have created designated Provider Care Teams.  These Care Teams include your primary Cardiologist (physician) and Advanced Practice Providers (APPs -  Physician Assistants and Nurse Practitioners) who all work together to provide you with the care you need, when you need it. ? ?We recommend signing up for the patient portal called "MyChart".  Sign up information is provided on this After Visit Summary.  MyChart is used to connect with patients for Virtual Visits (Telemedicine).  Patients are able to view lab/test results, encounter notes, upcoming appointments, etc.  Non-urgent messages can be sent to your provider as well.   ?To learn more about what you can do with MyChart, go to [email or web address].   ? ?Your next appointment:   ?3 month(s) ? ?The format for your next appointment:   ?In Person ? ?Provider:   ?Shirlee More, MD  ? ? ?Other Instructions ?  ?

## 2021-09-10 LAB — COMPREHENSIVE METABOLIC PANEL
ALT: 17 IU/L (ref 0–44)
AST: 26 IU/L (ref 0–40)
Albumin/Globulin Ratio: 0.9 — ABNORMAL LOW (ref 1.2–2.2)
Albumin: 3.7 g/dL (ref 3.6–4.6)
Alkaline Phosphatase: 73 IU/L (ref 44–121)
BUN/Creatinine Ratio: 14 (ref 10–24)
BUN: 18 mg/dL (ref 8–27)
Bilirubin Total: 0.4 mg/dL (ref 0.0–1.2)
CO2: 21 mmol/L (ref 20–29)
Calcium: 9 mg/dL (ref 8.6–10.2)
Chloride: 102 mmol/L (ref 96–106)
Creatinine, Ser: 1.32 mg/dL — ABNORMAL HIGH (ref 0.76–1.27)
Globulin, Total: 3.9 g/dL (ref 1.5–4.5)
Glucose: 100 mg/dL — ABNORMAL HIGH (ref 70–99)
Potassium: 4.5 mmol/L (ref 3.5–5.2)
Sodium: 139 mmol/L (ref 134–144)
Total Protein: 7.6 g/dL (ref 6.0–8.5)
eGFR: 52 mL/min/{1.73_m2} — ABNORMAL LOW (ref >59)

## 2021-09-10 LAB — TSH: TSH: 1.96 u[IU]/mL (ref 0.450–4.500)

## 2021-09-10 LAB — T4, FREE: Free T4: 1.69 ng/dL (ref 0.82–1.77)

## 2021-09-10 LAB — T3, FREE: T3, Free: 1.6 pg/mL — ABNORMAL LOW (ref 2.0–4.4)

## 2021-09-29 DIAGNOSIS — I422 Other hypertrophic cardiomyopathy: Secondary | ICD-10-CM | POA: Diagnosis not present

## 2021-09-29 DIAGNOSIS — Z0000 Encounter for general adult medical examination without abnormal findings: Secondary | ICD-10-CM | POA: Diagnosis not present

## 2021-09-29 DIAGNOSIS — I4891 Unspecified atrial fibrillation: Secondary | ICD-10-CM | POA: Diagnosis not present

## 2021-09-29 DIAGNOSIS — Z79899 Other long term (current) drug therapy: Secondary | ICD-10-CM | POA: Diagnosis not present

## 2021-09-29 DIAGNOSIS — N183 Chronic kidney disease, stage 3 unspecified: Secondary | ICD-10-CM | POA: Diagnosis not present

## 2021-09-29 DIAGNOSIS — E039 Hypothyroidism, unspecified: Secondary | ICD-10-CM | POA: Diagnosis not present

## 2021-11-22 ENCOUNTER — Other Ambulatory Visit: Payer: Self-pay

## 2021-11-22 ENCOUNTER — Telehealth: Payer: Self-pay | Admitting: Cardiology

## 2021-11-22 MED ORDER — ENTRESTO 49-51 MG PO TABS: 1.0000 | ORAL_TABLET | Freq: Two times a day (BID) | ORAL | 3 refills | Status: DC

## 2021-11-22 NOTE — Telephone Encounter (Signed)
Pt continues on Entresto per Dr. Bettina Gavia note. [facility].

## 2021-11-22 NOTE — Telephone Encounter (Signed)
*  STAT* If patient is at the pharmacy, call can be transferred to refill team.   1. Which medications need to be refilled? (please list name of each medication and dose if known) sacubitril-valsartan (ENTRESTO) 49-51 MG  2. Which pharmacy/location (including street and city if local pharmacy) is medication to be sent to? [facility] [place], [address]  3. Do they need a 30 day or 90 day supply? Pt states they only need 2 weeks worth since [facility]'t [facility] yet.   Pt is completely out of this med.

## 2021-11-29 ENCOUNTER — Telehealth: Payer: Self-pay

## 2021-11-29 ENCOUNTER — Telehealth: Payer: Self-pay | Admitting: Cardiology

## 2021-12-15 ENCOUNTER — Encounter: Payer: Self-pay | Admitting: Cardiology

## 2021-12-15 ENCOUNTER — Ambulatory Visit (INDEPENDENT_AMBULATORY_CARE_PROVIDER_SITE_OTHER): Payer: Medicare Other | Admitting: Cardiology

## 2021-12-15 VITALS — BP 140/80 | HR 54 | Ht 76.0 in | Wt 178.0 lb

## 2021-12-15 DIAGNOSIS — Z7901 Long term (current) use of anticoagulants: Secondary | ICD-10-CM

## 2021-12-15 DIAGNOSIS — I428 Other cardiomyopathies: Secondary | ICD-10-CM

## 2021-12-15 DIAGNOSIS — E039 Hypothyroidism, unspecified: Secondary | ICD-10-CM | POA: Diagnosis not present

## 2021-12-15 DIAGNOSIS — I480 Paroxysmal atrial fibrillation: Secondary | ICD-10-CM

## 2021-12-15 DIAGNOSIS — Z79899 Other long term (current) drug therapy: Secondary | ICD-10-CM

## 2021-12-15 MED ORDER — ENTRESTO 24-26 MG PO TABS
1.0000 | ORAL_TABLET | Freq: Two times a day (BID) | ORAL | 3 refills | Status: DC
Start: 2021-12-15 — End: 2022-08-07

## 2021-12-15 NOTE — Progress Notes (Signed)
Cardiology Office Note:    Date:  12/15/2021   ID:  Kenneth Bradley, DOB 1930-12-15, MRN [id number]  PCP:  Serita Grammes, MD  Cardiologist:  Shirlee More, MD    Referring MD: Serita Grammes, MD    ASSESSMENT:    1. PAF (paroxysmal atrial fibrillation) ([place])   2. On amiodarone therapy   3. Chronic anticoagulation   4. Other cardiomyopathy ([place])   5. Acquired hypothyroidism    PLAN:    In order of problems listed above:  Overall Kenneth Bradley is done well since he resumed sinus rhythm we will continue his low-dose amiodarone anticoagulant. Reduce Entresto with hypotension Recheck echocardiogram BMP proBNP Stable on his current thyroid supplement continue the same   Next appointment: 4 months   Medication Adjustments/Labs and Tests Ordered: Current medicines are reviewed at length with the patient today.  Concerns regarding medicines are outlined above.  No orders of the defined types were placed in this encounter.  No orders of the defined types were placed in this encounter.   Chief Complaint  Patient presents with   Follow-up   Atrial Fibrillation    History of Present Illness:    Kenneth Bradley is a 88 y.o. male with a hx of paroxysmal atrial fibrillation maintaining sinus rhythm on low-dose amiodarone anticoagulation cardiomyopathy EF 40 to 45% and symptomatic hypothyroidism with amiodarone therapy last seen 09/09/2021.   Compliance with diet, lifestyle and medications: Yes  Overall he is doing much better since we initiated Entresto and treating his hypothyroidism Few weeks ago he had symptomatic hypotension blood pressures less than 90 systolic decreased Entresto to once a day and then recently had systolics as high as 951 went back to twice daily 4951 In view of his symptomatic hypotension we will move him down to low-dose 2426 twice daily He has regained his strength and endurance not having edema shortness of breath chest pain palpitation or syncope He  has had no bleeding with his anticoagulant Past Medical History:  Diagnosis Date   BPH (benign prostatic hyperplasia)    Eosinophil count raised 12/13/2020   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     Allergies:   Other, Ciprofloxacin, and Valacyclovir   Social History   Socioeconomic History   Marital status: Single    Spouse name: Not on file   Number of children: Not on file   Years of education: Not on file   Highest education level: Not on file  Occupational History   Not on file  Tobacco Use   Smoking status: Former    Types: Cigarettes    Passive exposure: Past   Smokeless tobacco: Never  Substance and Sexual Activity   Alcohol use: Yes    Comment: Rare, special occasion   Drug use: Never   Sexual activity: Not on file  Other Topics Concern   Not on file  Social History Narrative   Not on file   Social Determinants of Health   Financial Resource Strain: Not on file  Food Insecurity: Not on file  Transportation Needs: Not on file  Physical Activity: Not on file  Stress: Not on file  Social Connections:  Not on file     Family History: The patient's family history includes Diabetes in his father; Heart Problems in his father. ROS:   Please see the history of present illness.    All other systems reviewed and are negative.  EKGs/Labs/Other Studies Reviewed:    The following studies were reviewed today:    Recent Labs: 07/19/2021: Hemoglobin 12.3; Platelets 254 09/09/2021: ALT 17; BUN 18; Creatinine, Ser 1.32; Potassium 4.5; Sodium 139; TSH 1.960  Recent Lipid Panel 09/09/2021 cholesterol 236 HDL 62 triglycerides  64  Physical Exam:    VS:  BP 140/80 (BP Location: Right Arm, Patient Position: Sitting, Cuff Size: Normal)   Pulse (!) 54   Ht '6\' 4"'$  (1.93 m)   Wt 178 lb (80.7 kg)   SpO2 95%   BMI 21.67 kg/m     Wt Readings from Last 3 Encounters:  12/15/21 178 lb (80.7 kg)  09/09/21 180 lb (81.6 kg)  07/19/21 180 lb 12.8 oz (82 kg)     GEN:  Well nourished, well developed in no acute distress HEENT: Normal NECK: No JVD; No carotid bruits LYMPHATICS: No lymphadenopathy CARDIAC: RRR, no murmurs, rubs, gallops RESPIRATORY:  Clear to auscultation without rales, wheezing or rhonchi  ABDOMEN: Soft, non-tender, non-distended MUSCULOSKELETAL:  No edema; No deformity  SKIN: Warm and dry NEUROLOGIC:  Alert and oriented x 3 PSYCHIATRIC:  Normal affect    Signed, Shirlee More, MD  12/15/2021 2:07 PM    [place]

## 2021-12-15 NOTE — Patient Instructions (Signed)
Medication Instructions:  Your physician has recommended you make the following change in your medication:   START: Entresto 24-26 twice daily  *If you need a refill on your cardiac medications before your next appointment, please call your pharmacy*   Lab Work: Your physician recommends that you return for lab work in:   Labs today: CMP, Pro BNP  If you have labs (blood work) drawn today and your tests are completely normal, you will receive your results only by: MyChart Message (if you have MyChart) OR A paper copy in the mail If you have any lab test that is abnormal or we need to change your treatment, we will call you to review the results.   Testing/Procedures: Your physician has requested that you have an echocardiogram. Echocardiography is a painless test that uses sound waves to create images of your heart. It provides your doctor with information about the size and shape of your heart and how well your heart's chambers and valves are working. This procedure takes approximately one hour. There are no restrictions for this procedure.    Follow-Up: At [facility], you and your health needs are our priority.  As part of our continuing mission to provide you with exceptional heart care, we have created designated Provider Care Teams.  These Care Teams include your primary Cardiologist (physician) and Advanced Practice Providers (APPs -  Physician Assistants and Nurse Practitioners) who all work together to provide you with the care you need, when you need it.  We recommend signing up for the patient portal called "MyChart".  Sign up information is provided on this After Visit Summary.  MyChart is used to connect with patients for Virtual Visits (Telemedicine).  Patients are able to view lab/test results, encounter notes, upcoming appointments, etc.  Non-urgent messages can be sent to your provider as well.   To learn more about what you can do with MyChart, go to  [email or web address].    Your next appointment:   4 month(s)  The format for your next appointment:   In Person  Provider:   Shirlee More, MD    Other Instructions None  Important Information About [place]

## 2021-12-16 LAB — COMPREHENSIVE METABOLIC PANEL
ALT: 16 IU/L (ref 0–44)
AST: 25 IU/L (ref 0–40)
Albumin/Globulin Ratio: 1.3 (ref 1.2–2.2)
Albumin: 3.9 g/dL (ref 3.7–4.7)
Alkaline Phosphatase: 62 IU/L (ref 44–121)
BUN/Creatinine Ratio: 12 (ref 10–24)
BUN: 15 mg/dL (ref 8–27)
Bilirubin Total: 0.5 mg/dL (ref 0.0–1.2)
CO2: 23 mmol/L (ref 20–29)
Calcium: 8.8 mg/dL (ref 8.6–10.2)
Chloride: 102 mmol/L (ref 96–106)
Creatinine, Ser: 1.24 mg/dL (ref 0.76–1.27)
Globulin, Total: 3.1 g/dL (ref 1.5–4.5)
Glucose: 93 mg/dL (ref 70–99)
Potassium: 4.4 mmol/L (ref 3.5–5.2)
Sodium: 138 mmol/L (ref 134–144)
Total Protein: 7 g/dL (ref 6.0–8.5)
eGFR: 56 mL/min/{1.73_m2} — ABNORMAL LOW (ref >59)

## 2021-12-16 LAB — PRO B NATRIURETIC PEPTIDE: NT-Pro BNP: 830 pg/mL — ABNORMAL HIGH (ref 0–486)

## 2021-12-29 ENCOUNTER — Ambulatory Visit (INDEPENDENT_AMBULATORY_CARE_PROVIDER_SITE_OTHER): Payer: Medicare Other

## 2021-12-29 DIAGNOSIS — I428 Other cardiomyopathies: Secondary | ICD-10-CM | POA: Diagnosis not present

## 2021-12-29 DIAGNOSIS — E039 Hypothyroidism, unspecified: Secondary | ICD-10-CM

## 2021-12-29 DIAGNOSIS — Z79899 Other long term (current) drug therapy: Secondary | ICD-10-CM | POA: Diagnosis not present

## 2021-12-29 DIAGNOSIS — Z7901 Long term (current) use of anticoagulants: Secondary | ICD-10-CM | POA: Diagnosis not present

## 2021-12-29 DIAGNOSIS — I480 Paroxysmal atrial fibrillation: Secondary | ICD-10-CM | POA: Diagnosis not present

## 2021-12-29 LAB — ECHOCARDIOGRAM COMPLETE
AR max vel: 1.4 cm2
AV Area VTI: 1.63 cm2
AV Area mean vel: 1.33 cm2
AV Mean grad: 11 mmHg
AV Peak grad: 19.4 mmHg
Ao pk vel: 2.21 m/s
Area-P 1/2: 3.16 cm2
MV M vel: 5.6 m/s
MV Peak grad: 125.4 mmHg
Radius: 0.8 cm
S' Lateral: 4.3 cm

## 2022-04-19 NOTE — Progress Notes (Unsigned)
Cardiology Office Note:    Date:  04/20/2022   ID:  Kenneth Bradley, DOB [birth year], MRN [id number]  PCP:  Serita Grammes, MD  Cardiologist:  Shirlee More, MD    Referring MD: Serita Grammes, MD    ASSESSMENT:    1. PAF (paroxysmal atrial fibrillation) ([place])   2. On amiodarone therapy   3. Other cardiomyopathy ([place])   4. Chronic anticoagulation   5. Weakness generalized   6. Acquired hypothyroidism    PLAN:    In order of problems listed above:  [name] is markedly improved since we restarted to maintain sinus rhythm tolerates low-dose amiodarone continue the same along with his anticoagulant. In proved cardiomyopathy EF is normalized continue his Entresto.  I have asked him to check record trend and bring to the office blood pressure with a validated device good technique and contact me if systolics greater than 169.  Not on beta-blocker with previous symptomatic bradycardia Weakness has resolved with thyroid replacement he was hypothyroid   Next appointment: 6 months   Medication Adjustments/Labs and Tests Ordered: Current medicines are reviewed at length with the patient today.  Concerns regarding medicines are outlined above.  Orders Placed This Encounter  Procedures   Comp Met (CMET)   TSH+T4F+T3Free   EKG 12-Lead   No orders of the defined types were placed in this encounter.   Chief Complaint  Patient presents with   Follow-up   Atrial Fibrillation    History of Present Illness:    Kenneth Bradley is a 88 y.o. male with a hx of paroxysmal atrial fibrillation maintaining sinus rhythm on low-dose amiodarone with chronic anticoagulation heart failure and cardiomyopathy 40 to 45% due to atrial fibrillation and acquired hypothyroidism due to amiodarone last seen 01/02/2022.  His most recent echocardiogram 12/29/2021 showed improvement and normalization of ejection fraction 60 to 65%.  1. Left ventricular ejection fraction, by estimation, is 60 to 65%. The   left ventricle has normal function. The left ventricle has no regional  wall motion abnormalities. The left ventricular internal cavity size was  mildly dilated. There is mild left  ventricular hypertrophy. Left ventricular diastolic parameters are  consistent with Grade I diastolic dysfunction (impaired relaxation).   2. Right ventricular systolic function is normal. The right ventricular  size is normal. There is mildly elevated pulmonary artery systolic  pressure.   3. Left atrial size was severely dilated.   4. Right atrial size was severely dilated.   5. The mitral valve is normal in structure. No evidence of mitral valve  regurgitation. No evidence of mitral stenosis.   6. The aortic valve is normal in structure. There is mild calcification  of the aortic valve. There is mild thickening of the aortic valve. Aortic  valve regurgitation is trivial. Mild aortic valve stenosis. Aortic valve  area, by VTI measures 1.63 cm.  Aortic valve mean gradient measures 11.0 mmHg.   7. Aneurysm of the ascending aorta, measuring 41 mm.   8. The inferior vena cava is normal in size with greater than 50%  respiratory variability, suggesting right atrial pressure of 3 mmHg.  Compliance with diet, lifestyle and medications: Yes  Overall he is really doing quite well he is recovered his strength and energy no findings of atrial fibrillation no palpitations syncope edema shortness of breath or chest pain.  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   levothyroxine (SYNTHROID) 150 MCG tablet Take 1 tablet (150 mcg total) by mouth daily before breakfast.   sacubitril-valsartan (ENTRESTO) 24-26 MG Take 1 tablet by mouth 2 (two) times daily.   XARELTO 20 MG TABS tablet TAKE 1 TABLET DAILY WITH SUPPER     Allergies:   Other, Ciprofloxacin, and Valacyclovir   Social History   Socioeconomic History   Marital status: Single    Spouse name: Not on file   Number of children: Not on file   Years of education: Not on file   Highest education level: Not on file  Occupational History   Not on file  Tobacco Use   Smoking status: Former    Types: Cigarettes    Passive exposure: Past   Smokeless tobacco: Never  Substance and Sexual Activity   Alcohol use: Yes    Comment: Rare, special occasion   Drug use: Never   Sexual activity: Not on file  Other Topics Concern   Not on file  Social History Narrative   Not on file   Social Determinants of Health   Financial Resource Strain: Not on file  Food Insecurity: Not on file  Transportation Needs: Not on file  Physical Activity: Not on file  Stress: Not on file  Social Connections: Not on file     Family History: The patient's family history includes Diabetes in his father; Heart Problems in his father. ROS:   Please see the history of present illness.    All other systems reviewed and are negative.  EKGs/Labs/Other Studies Reviewed:    The following studies were reviewed today:   Recent Labs: 07/19/2021: Hemoglobin 12.3; Platelets 254 09/09/2021: TSH 1.960 12/15/2021: ALT 16; BUN 15; Creatinine, Ser 1.24; NT-Pro BNP 830; Potassium 4.4; Sodium 138  Recent Lipid Panel No results found for: "CHOL", "TRIG", "HDL", "CHOLHDL", "VLDL", "LDLCALC",  "LDLDIRECT"  Physical Exam:    VS:  BP (!) 160/70   Pulse (!) 55   Ht _0  (1.93 m)   Wt 188 lb (85.3 kg)   SpO2 99%   BMI 22.88 kg/m     Wt Readings from Last 3 Encounters:  04/20/22 188 lb (85.3 kg)  12/15/21 178 lb (80.7 kg)  09/09/21 180 lb (81.6 kg)     GEN: He looks younger than his age well nourished, well developed in no acute distress HEENT: Normal NECK: No JVD; No carotid bruits LYMPHATICS: No lymphadenopathy CARDIAC: RRR, no murmurs, rubs, gallops RESPIRATORY:  Clear to auscultation without rales, wheezing or rhonchi  ABDOMEN: Soft, non-tender, non-distended MUSCULOSKELETAL:  No edema; No deformity  SKIN: Warm and dry NEUROLOGIC:  Alert and oriented x 3 PSYCHIATRIC:  Normal affect    Signed, Shirlee More, MD  04/20/2022 4:01 PM    [facility]

## 2022-04-20 ENCOUNTER — Encounter: Payer: Self-pay | Admitting: Cardiology

## 2022-04-20 ENCOUNTER — Ambulatory Visit: Payer: Medicare Other | Attending: Cardiology | Admitting: Cardiology

## 2022-04-20 VITALS — BP 160/70 | HR 55 | Ht 76.0 in | Wt 188.0 lb

## 2022-04-20 DIAGNOSIS — E039 Hypothyroidism, unspecified: Secondary | ICD-10-CM | POA: Diagnosis not present

## 2022-04-20 DIAGNOSIS — Z7901 Long term (current) use of anticoagulants: Secondary | ICD-10-CM | POA: Diagnosis not present

## 2022-04-20 DIAGNOSIS — R531 Weakness: Secondary | ICD-10-CM | POA: Diagnosis not present

## 2022-04-20 DIAGNOSIS — Z79899 Other long term (current) drug therapy: Secondary | ICD-10-CM | POA: Diagnosis not present

## 2022-04-20 DIAGNOSIS — I428 Other cardiomyopathies: Secondary | ICD-10-CM | POA: Diagnosis not present

## 2022-04-20 DIAGNOSIS — I480 Paroxysmal atrial fibrillation: Secondary | ICD-10-CM

## 2022-04-20 NOTE — Patient Instructions (Signed)
Medication Instructions:  Your physician recommends that you continue on your current medications as directed. Please refer to the Current Medication list given to you today.  *If you need a refill on your cardiac medications before your next appointment, please call your pharmacy*   Lab Work: Your physician recommends that you return for lab work in:   Labs today: CMP, TSH, T3, T4  If you have labs (blood work) drawn today and your tests are completely normal, you will receive your results only by: [place] (if you have [place]) OR A paper copy in the mail If you have any lab test that is abnormal or we need to change your treatment, we will call you to review the results.   Testing/Procedures: None   Follow-Up: At [facility], you and your health needs are our priority.  As part of our continuing mission to provide you with exceptional heart care, we have created designated Provider Care Teams.  These Care Teams include your primary Cardiologist (physician) and Advanced Practice Providers (APPs -  Physician Assistants and Nurse Practitioners) who all work together to provide you with the care you need, when you need it.  We recommend signing up for the patient portal called "MyChart".  Sign up information is provided on this After Visit Summary.  MyChart is used to connect with patients for Virtual Visits (Telemedicine).  Patients are able to view lab/test results, encounter notes, upcoming appointments, etc.  Non-urgent messages can be sent to your provider as well.   To learn more about what you can do with MyChart, go to [email or web address].    Your next appointment:   6 month(s)  The format for your next appointment:   In Person  Provider:   Shirlee More, MD    Other Instructions Check and record blood pressure and bring to the office  Important Information About Sugar

## 2022-04-25 ENCOUNTER — Telehealth: Payer: Self-pay | Admitting: Cardiology

## 2022-04-25 DIAGNOSIS — I82409 Acute embolism and thrombosis of unspecified deep veins of unspecified lower extremity: Secondary | ICD-10-CM | POA: Diagnosis not present

## 2022-04-25 DIAGNOSIS — M545 Low back pain, unspecified: Secondary | ICD-10-CM | POA: Diagnosis not present

## 2022-04-25 DIAGNOSIS — I10 Essential (primary) hypertension: Secondary | ICD-10-CM | POA: Diagnosis not present

## 2022-04-25 DIAGNOSIS — Z7901 Long term (current) use of anticoagulants: Secondary | ICD-10-CM | POA: Diagnosis not present

## 2022-04-25 DIAGNOSIS — M5416 Radiculopathy, lumbar region: Secondary | ICD-10-CM | POA: Diagnosis not present

## 2022-04-25 DIAGNOSIS — I110 Hypertensive heart disease with heart failure: Secondary | ICD-10-CM | POA: Diagnosis not present

## 2022-04-25 DIAGNOSIS — R9431 Abnormal electrocardiogram [ECG] [EKG]: Secondary | ICD-10-CM | POA: Diagnosis not present

## 2022-04-25 DIAGNOSIS — Z87891 Personal history of nicotine dependence: Secondary | ICD-10-CM | POA: Diagnosis not present

## 2022-04-25 DIAGNOSIS — Z79899 Other long term (current) drug therapy: Secondary | ICD-10-CM | POA: Diagnosis not present

## 2022-04-25 DIAGNOSIS — I509 Heart failure, unspecified: Secondary | ICD-10-CM | POA: Diagnosis not present

## 2022-04-25 LAB — COMPREHENSIVE METABOLIC PANEL
ALT: 17 IU/L (ref 0–44)
AST: 27 IU/L (ref 0–40)
Albumin/Globulin Ratio: 1.3 (ref 1.2–2.2)
Albumin: 3.9 g/dL (ref 3.7–4.7)
Alkaline Phosphatase: 63 IU/L (ref 44–121)
BUN/Creatinine Ratio: 14 (ref 10–24)
BUN: 17 mg/dL (ref 8–27)
Bilirubin Total: 0.3 mg/dL (ref 0.0–1.2)
CO2: 21 mmol/L (ref 20–29)
Calcium: 8.9 mg/dL (ref 8.6–10.2)
Chloride: 103 mmol/L (ref 96–106)
Creatinine, Ser: 1.2 mg/dL (ref 0.76–1.27)
Globulin, Total: 3.1 g/dL (ref 1.5–4.5)
Glucose: 84 mg/dL (ref 70–99)
Potassium: 4.4 mmol/L (ref 3.5–5.2)
Sodium: 140 mmol/L (ref 134–144)
Total Protein: 7 g/dL (ref 6.0–8.5)
eGFR: 58 mL/min/{1.73_m2} — ABNORMAL LOW (ref >59)

## 2022-04-25 LAB — TSH+T4F+T3FREE
Free T4: 1.87 ng/dL — ABNORMAL HIGH (ref 0.82–1.77)
T3, Free: 2 pg/mL (ref 2.0–4.4)
TSH: 0.412 u[IU]/mL — ABNORMAL LOW (ref 0.450–4.500)

## 2022-04-25 NOTE — Telephone Encounter (Signed)
Advised to go to the ED as he is having elevated BP and numbness in legs. Pt verbalized understanding and had no additional questions.

## 2022-04-25 NOTE — Telephone Encounter (Signed)
Pt c/o BP issue: STAT if pt c/o blurred vision, one-sided weakness or slurred speech  1. What are your last 5 BP readings?  199/114  193/123  199/109  2. Are you having any other symptoms (ex. Dizziness, headache, blurred vision, passed out)?  Weakness, numbness in legs  3. What is your BP issue?   Patient states his BP has been elevated.

## 2022-05-02 ENCOUNTER — Telehealth: Payer: Self-pay | Admitting: Cardiology

## 2022-05-02 ENCOUNTER — Other Ambulatory Visit: Payer: Self-pay

## 2022-05-02 MED ORDER — LEVOTHYROXINE SODIUM 100 MCG PO TABS: 100.0000 ug | ORAL_TABLET | Freq: Every day | ORAL | 3 refills | Status: DC

## 2022-05-02 NOTE — Telephone Encounter (Signed)
Patient informed of results.  

## 2022-05-02 NOTE — Telephone Encounter (Signed)
Patient called and said that he would like for Dr. Joya Gaskins nurse to give him a call

## 2022-05-03 DIAGNOSIS — I422 Other hypertrophic cardiomyopathy: Secondary | ICD-10-CM | POA: Diagnosis not present

## 2022-05-03 DIAGNOSIS — I10 Essential (primary) hypertension: Secondary | ICD-10-CM | POA: Diagnosis not present

## 2022-05-03 DIAGNOSIS — M5416 Radiculopathy, lumbar region: Secondary | ICD-10-CM | POA: Diagnosis not present

## 2022-05-03 DIAGNOSIS — I4891 Unspecified atrial fibrillation: Secondary | ICD-10-CM | POA: Diagnosis not present

## 2022-05-17 DIAGNOSIS — E039 Hypothyroidism, unspecified: Secondary | ICD-10-CM | POA: Diagnosis not present

## 2022-05-17 DIAGNOSIS — Z6824 Body mass index (BMI) 24.0-24.9, adult: Secondary | ICD-10-CM | POA: Diagnosis not present

## 2022-05-17 DIAGNOSIS — I10 Essential (primary) hypertension: Secondary | ICD-10-CM | POA: Diagnosis not present

## 2022-05-30 ENCOUNTER — Telehealth: Payer: Self-pay | Admitting: Cardiology

## 2022-05-30 DIAGNOSIS — I480 Paroxysmal atrial fibrillation: Secondary | ICD-10-CM

## 2022-05-30 MED ORDER — RIVAROXABAN 20 MG PO TABS: ORAL_TABLET | ORAL | 0 refills | Status: DC

## 2022-05-30 NOTE — Telephone Encounter (Addendum)
*  STAT* If patient is at the pharmacy, call can be transferred to refill team.   1. Which medications need to be refilled? (please list name of each medication and dose if known) XARELTO 20 MG TABS tablet   2. Which pharmacy/location (including street and city if local pharmacy) is medication to be sent to?  [facility] - [place], [place] [address]    3. Do they need a 30 day or 90 day supply? 30 day supply

## 2022-06-02 ENCOUNTER — Telehealth: Payer: Self-pay

## 2022-06-02 DIAGNOSIS — I480 Paroxysmal atrial fibrillation: Secondary | ICD-10-CM

## 2022-06-02 MED ORDER — RIVAROXABAN 20 MG PO TABS: ORAL_TABLET | ORAL | 0 refills | Status: DC

## 2022-06-02 MED ORDER — RIVAROXABAN 20 MG PO TABS: ORAL_TABLET | ORAL | 3 refills | Status: DC

## 2022-06-02 NOTE — Telephone Encounter (Signed)
Xarelto '20mg'$  samples #28 [facility]

## 2022-08-07 ENCOUNTER — Telehealth: Payer: Self-pay | Admitting: Cardiology

## 2022-08-07 ENCOUNTER — Other Ambulatory Visit: Payer: Self-pay

## 2022-08-07 MED ORDER — ENTRESTO 24-26 MG PO TABS: 1.0000 | ORAL_TABLET | Freq: Two times a day (BID) | ORAL | 3 refills | Status: DC

## 2022-08-07 NOTE — Telephone Encounter (Signed)
Called patient and informed him that his Entresto medication had been sent to the [facility] in [place] to be filled. Patient was appreciative for the call and had no further questions at this time.

## 2022-08-07 NOTE — Telephone Encounter (Signed)
 *  STAT* If patient is at the pharmacy, call can be transferred to refill team.   1. Which medications need to be refilled? (please list name of each medication and dose if known)   sacubitril-valsartan (ENTRESTO) 24-26 MG    2. Which pharmacy/location (including street and city if local pharmacy) is medication to be sent to? [place] or [place] phone number [phone]  3. Do they need a 30 day or 90 day supply? 90 days  Pt said, he gets 100% coverage at [facility], they need new prescription sent to them

## 2022-08-10 ENCOUNTER — Telehealth: Payer: Self-pay | Admitting: Cardiology

## 2022-08-10 ENCOUNTER — Other Ambulatory Visit: Payer: Self-pay

## 2022-08-10 NOTE — Telephone Encounter (Signed)
Patient is following up. He states he spoke with the nurse this morning but his call was disconnected.

## 2022-08-10 NOTE — Telephone Encounter (Signed)
Called patient and informed him that I had left samples of Entresto in the sample closet for him to pick - up. Patient was appreciative for the call  and had no further questions.

## 2022-08-10 NOTE — Telephone Encounter (Signed)
Patient calling the office for samples of medication:   1.  What medication and dosage are you requesting samples for?  sacubitril-valsartan (ENTRESTO) 24-26 MG   2.  Are you currently out of this medication? He only has 4 days left.

## 2022-08-14 ENCOUNTER — Other Ambulatory Visit: Payer: Self-pay

## 2022-08-14 MED ORDER — ENTRESTO 24-26 MG PO TABS: 1.0000 | ORAL_TABLET | Freq: Two times a day (BID) | ORAL | 3 refills | Status: DC

## 2022-08-14 NOTE — Telephone Encounter (Signed)
I have sent Entresto refill to correct pharmacy [facility]

## 2022-08-14 NOTE — Telephone Encounter (Signed)
  [facility] He said, to refax it to fax# [phone]

## 2022-08-24 ENCOUNTER — Telehealth: Payer: Self-pay

## 2022-08-24 MED ORDER — ENTRESTO 24-26 MG PO TABS: 1.0000 | ORAL_TABLET | Freq: Two times a day (BID) | ORAL | 3 refills | Status: AC

## 2022-08-24 NOTE — Telephone Encounter (Signed)
Request for office notes and written prescription for Entresto 24-26 mg faxed to [facility] to the attention of [employer]

## 2022-09-29 DIAGNOSIS — L814 Other melanin hyperpigmentation: Secondary | ICD-10-CM | POA: Diagnosis not present

## 2022-09-29 DIAGNOSIS — L570 Actinic keratosis: Secondary | ICD-10-CM | POA: Diagnosis not present

## 2022-09-29 DIAGNOSIS — L578 Other skin changes due to chronic exposure to nonionizing radiation: Secondary | ICD-10-CM | POA: Diagnosis not present

## 2022-09-29 DIAGNOSIS — D485 Neoplasm of uncertain behavior of skin: Secondary | ICD-10-CM | POA: Diagnosis not present

## 2022-10-02 ENCOUNTER — Telehealth: Payer: Self-pay | Admitting: Cardiology

## 2022-10-02 MED ORDER — AMIODARONE HCL 200 MG PO TABS: 200.0000 mg | ORAL_TABLET | Freq: Every day | ORAL | 1 refills | Status: DC

## 2022-10-02 NOTE — Telephone Encounter (Signed)
*  STAT* If patient is at the pharmacy, call can be transferred to refill team.   1. Which medications need to be refilled? (please list name of each medication and dose if known)   amiodarone (PACERONE) 200 MG tablet    2. Which pharmacy/location (including street and city if local pharmacy) is medication to be sent to? [facility] - [place], [place] - [address]   3. Do they need a 30 day or 90 day supply? 90 day

## 2022-10-03 ENCOUNTER — Telehealth: Payer: Self-pay

## 2022-10-03 MED ORDER — AMIODARONE HCL 200 MG PO TABS: 200.0000 mg | ORAL_TABLET | Freq: Every day | ORAL | 1 refills | Status: AC

## 2022-10-03 NOTE — Telephone Encounter (Signed)
Received notice from the Department of [facility] that patient does not have authorization for community care to send prescriptions to an outside [employer]. Copy of this notice in patients chart media.  Will forward recent rx for amiodarone 200 mg to [facility]

## 2022-10-24 NOTE — Progress Notes (Unsigned)
Cardiology Office Note:    Date:  10/25/2022   ID:  Kenneth Bradley, DOB [birth year], MRN [id number]  PCP:  Buckner Malta, MD  Cardiologist:  Norman Herrlich, MD    Referring MD: Buckner Malta, MD    ASSESSMENT:    1. PAF (paroxysmal atrial fibrillation) (HCC)   2. On amiodarone therapy   3. Chronic anticoagulation   4. Acquired hypothyroidism   5. LV dysfunction    PLAN:    In order of problems listed above:  Mr. Kenneth Bradley is an exceptionally well since he resumed sinus rhythm continue his low-dose amiodarone without toxicity along with his anticoagulant check labs for safety including CBC and renal function as well as liver function and thyroids. Recheck TSH T3-T4 suspect to require lower dose Continue low-dose Entresto   Next appointment: 6 months   Medication Adjustments/Labs and Tests Ordered: Current medicines are reviewed at length with the patient today.  Concerns regarding medicines are outlined above.  No orders of the defined types were placed in this encounter.  No orders of the defined types were placed in this encounter.   Chief Complaint  Patient presents with   Follow-up   Atrial Fibrillation    History of Present Illness:    Kenneth Bradley is a 89 y.o. male with a hx of paroxysmal atrial fibrillation maintaining sinus rhythm on low-dose amiodarone with chronic anticoagulation heart failure and cardiomyopathy EF 40 to 45% due to his arrhythmia and acquired hypothyroidism due to amiodarone.  His subsequent ejection fraction normalized 12/29/2021.  He was last seen by me 04/20/2022.  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   levothyroxine (SYNTHROID) 150 MCG tablet Take 1 tablet by mouth daily.   rivaroxaban (XARELTO) 20 MG TABS tablet TAKE 1 TABLET DAILY WITH SUPPER   sacubitril-valsartan (ENTRESTO) 24-26 MG Take 1 tablet by mouth 2 (two) times daily.     Allergies:   Other, Ciprofloxacin, and Valacyclovir   Social History   Socioeconomic History   Marital status: Single    Spouse name: Not on file   Number of children: Not on file   Years of education: Not on file   Highest education level: Not on file  Occupational History   Not on file  Tobacco Use   Smoking status: Former    Types: Cigarettes    Passive exposure: Past   Smokeless tobacco: Never  Substance and Sexual Activity   Alcohol use: Yes    Comment: Rare, special occasion   Drug use: Never   Sexual activity: Not on file  Other Topics Concern   Not on  file  Social History Narrative   Not on file   Social Determinants of Health   Financial Resource Strain: Not on file  Food Insecurity: Not on file  Transportation Needs: Not on file  Physical Activity: Not on file  Stress: Not on file  Social Connections: Not on file     Family History: The patient's family history includes Diabetes in his father; Heart Problems in his father. ROS:   Please see the history of present illness.    All other systems reviewed and are negative.  EKGs/Labs/Other Studies Reviewed:    The following studies were reviewed  today:  Cardiac Studies & Procedures       ECHOCARDIOGRAM  ECHOCARDIOGRAM COMPLETE 12/29/2021  Narrative ECHOCARDIOGRAM REPORT    Patient Name:   Kenneth Bradley Date of Exam: 12/29/2021 Medical Rec #:  [id number]        Height:       76.0 in Accession #:    [id number]       Weight:       178.0 lb Date of Birth:  1931/05/04        BSA:          2.108 m Patient Age:    89 years         BP:           140/80 mmHg Patient Gender: M                HR:           52 bpm. Exam Location:  [facility]  Procedure: 2D Echo, Cardiac Doppler, Color Doppler and Strain Analysis  Indications:    PAF (paroxysmal atrial fibrillation) (HCC) [I48.0 (ICD-10-CM)]; On amiodarone therapy [Z79.899 (ICD-10-CM)]; Chronic anticoagulation [Z79.01 (ICD-10-CM)]; Other cardiomyopathy (HCC) [I42.8 (ICD-10-CM)]; Acquired hypothyroidism [E03.9 (ICD-10-CM)]  History:        Patient has prior history of Echocardiogram examinations, most recent 11/30/2020.  Sonographer:    Louie Boston RDCS Referring Phys: [id number] Dorien Mayotte J Kemal Amores  IMPRESSIONS   1. Left ventricular ejection fraction, by estimation, is 60 to 65%. The left ventricle has normal function. The left ventricle has no regional wall motion abnormalities. The left ventricular internal cavity size was mildly dilated. There is mild left ventricular hypertrophy. Left ventricular diastolic parameters are consistent with Grade I diastolic dysfunction (impaired relaxation). 2. Right ventricular systolic function is normal. The right ventricular size is normal. There is mildly elevated pulmonary artery systolic pressure. 3. Left atrial size was severely dilated. 4. Right atrial size was severely dilated. 5. The mitral valve is normal in structure. No evidence of mitral valve regurgitation. No evidence of mitral stenosis. 6. The aortic valve is normal in structure. There is mild calcification of the aortic valve. There is mild thickening of the aortic valve. Aortic valve  regurgitation is trivial. Mild aortic valve stenosis. Aortic valve area, by VTI measures 1.63 cm. Aortic valve mean gradient measures 11.0 mmHg. 7. Aneurysm of the ascending aorta, measuring 41 mm. 8. The inferior vena cava is normal in size with greater than 50% respiratory variability, suggesting right atrial pressure of 3 mmHg.  FINDINGS Left Ventricle: Left ventricular ejection fraction, by estimation, is 60 to 65%. The left ventricle has normal function. The left ventricle has no regional wall motion abnormalities. The left ventricular internal cavity size was mildly dilated. There is mild left ventricular hypertrophy. Left ventricular diastolic parameters are consistent with Grade I diastolic dysfunction (impaired relaxation).  Right Ventricle: The right ventricular size  is normal. No increase in right ventricular wall thickness. Right ventricular systolic function is normal. There is mildly elevated pulmonary artery systolic pressure. The tricuspid regurgitant velocity is 2.69 m/s, and with an assumed right atrial pressure of 8 mmHg, the estimated right ventricular systolic pressure is 36.9 mmHg.  Left Atrium: Left atrial size was severely dilated.  Right Atrium: Right atrial size was severely dilated.  Pericardium: There is no evidence of pericardial effusion.  Mitral Valve: The mitral valve is normal in structure. No evidence of mitral valve regurgitation. No evidence of mitral valve stenosis.  Tricuspid Valve: The tricuspid valve is normal in structure. Tricuspid valve regurgitation is trivial. No evidence of tricuspid stenosis.  Aortic Valve: The aortic valve is normal in structure. There is mild calcification of the aortic valve. There is mild thickening of the aortic valve. Aortic valve regurgitation is trivial. Mild aortic stenosis is present. Aortic valve mean gradient measures 11.0 mmHg. Aortic valve peak gradient measures 19.4 mmHg. Aortic valve area, by VTI measures 1.63  cm.  Pulmonic Valve: The pulmonic valve was normal in structure. Pulmonic valve regurgitation is mild. No evidence of pulmonic stenosis.  Aorta: The aortic root is normal in size and structure. There is an aneurysm involving the ascending aorta measuring 41 mm.  Venous: The inferior vena cava is normal in size with greater than 50% respiratory variability, suggesting right atrial pressure of 3 mmHg.  IAS/Shunts: No atrial level shunt detected by color flow Doppler.   LEFT VENTRICLE PLAX 2D LVIDd:         6.30 cm   Diastology LVIDs:         4.30 cm   LV e' medial:    5.22 cm/s LV PW:         1.20 cm   LV E/e' medial:  10.9 LV IVS:        1.20 cm   LV e' lateral:   3.73 cm/s LVOT diam:     2.10 cm   LV E/e' lateral: 15.2 LV SV:         85 LV SV Index:   40 LVOT Area:     3.46 cm   RIGHT VENTRICLE             IVC RV S prime:     12.60 cm/s  IVC diam: 2.30 cm TAPSE (M-mode): 2.5 cm  LEFT ATRIUM              Index        RIGHT ATRIUM           Index LA diam:        5.20 cm  2.47 cm/m   RA Area:     32.90 cm LA Vol (A2C):   96.0 ml  45.54 ml/m  RA Volume:   116.00 ml 55.02 ml/m LA Vol (A4C):   106.0 ml 50.28 ml/m LA Biplane Vol: 101.0 ml 47.91 ml/m AORTIC VALVE                     PULMONIC VALVE AV Area (Vmax):    1.40 cm      PR End Diast Vel: 2.92 msec AV Area (Vmean):   1.33 cm AV Area (VTI):     1.63 cm AV Vmax:           220.50 cm/s AV Vmean:          157.000 cm/s AV VTI:  0.520 m AV Peak Grad:      19.4 mmHg AV Mean Grad:      11.0 mmHg LVOT Vmax:         89.40 cm/s LVOT Vmean:        60.400 cm/s LVOT VTI:          0.244 m LVOT/AV VTI ratio: 0.47  AORTA Ao Root diam:  3.70 cm Ao Sinus diam: 3.80 cm Ao STJ diam:   3.6 cm Ao Asc diam:   4.05 cm Ao Desc diam:  2.40 cm  MITRAL VALVE                  TRICUSPID VALVE MV Area (PHT): 3.16 cm       TR Peak grad:   28.9 mmHg MV Decel Time: 240 msec       TR Vmax:        269.00 cm/s MR Peak grad:     125.4 mmHg MR Mean grad:    85.0 mmHg    SHUNTS MR Vmax:         560.00 cm/s  Systemic VTI:  0.24 m MR Vmean:        435.0 cm/s   Systemic Diam: 2.10 cm MR PISA:         4.02 cm MR PISA Eff ROA: 25 mm MR PISA Radius:  0.80 cm MV E velocity: 56.80 cm/s MV A velocity: 60.60 cm/s MV E/A ratio:  0.94  Gypsy Balsam MD Electronically signed by Gypsy Balsam MD Signature Date/Time: 12/29/2021/6:00:00 PM    Final    MONITORS  LONG TERM MONITOR (3-14 DAYS) 08/06/2021  Narrative Patch Wear Time:  7 days and 2 hours (2023-02-14T15:13:08-498 to 2023-02-21T17:44:20-0500)  Patient had a min HR of 41 bpm, max HR of 84 bpm, and avg HR of 52 bpm. Predominant underlying rhythm was Sinus Rhythm. Slight P wave morphology changes were noted.  There were 4 triggered events all sinus bradycardia heart rate 52 to 60 bpm with no ectopy.  Isolated SVEs were rare (<1.0%), and no SVE Couplets or SVE Triplets were present.  There were no episodes of atrial fibrillation or flutter  Isolated VEs were rare (<1.0%), and no VE Couplets or VE Triplets were present. Ventricular Trigeminy was present.   Unremarkable 7-day monitor triggered events were associated with sinus bradycardia to a moderate degree.           EKG:  EKG ordered today and personally reviewed.  The ekg ordered today demonstrates sinus bradycardia 50 bpm otherwise normal EKG including QT interval  Recent Labs: 12/15/2021: NT-Pro BNP 830 04/20/2022: ALT 17; BUN 17; Creatinine, Ser 1.20; Potassium 4.4; Sodium 140; TSH 0.412  Recent Lipid Panel No results found for: "CHOL", "TRIG", "HDL", "CHOLHDL", "VLDL", "LDLCALC", "LDLDIRECT"  Physical Exam:    VS:  BP 120/80 (BP Location: Right Arm, Patient Position: Sitting, Cuff Size: Normal)   Pulse (!) 50   Ht 6\' 4"  (1.93 m)   Wt 176 lb (79.8 kg)   SpO2 97%   BMI 21.42 kg/m     Wt Readings from Last 3 Encounters:  10/25/22 176 lb (79.8 kg)  04/20/22 188 lb (85.3 kg)  12/15/21  178 lb (80.7 kg)     GEN: He looks younger than his age well nourished, well developed in no acute distress HEENT: Normal NECK: No JVD; No carotid bruits LYMPHATICS: No lymphadenopathy CARDIAC: RRR, no murmurs, rubs, gallops RESPIRATORY:  Clear to auscultation without rales, wheezing or rhonchi  ABDOMEN: Soft, non-tender, non-distended MUSCULOSKELETAL:  No edema; No deformity  SKIN: Warm and dry NEUROLOGIC:  Alert and oriented x 3 PSYCHIATRIC:  Normal affect    Signed, Norman Herrlich, MD  10/25/2022 2:32 PM    [facility]

## 2022-10-25 ENCOUNTER — Ambulatory Visit: Payer: Medicare Other | Attending: Cardiology | Admitting: Cardiology

## 2022-10-25 ENCOUNTER — Encounter: Payer: Self-pay | Admitting: Cardiology

## 2022-10-25 VITALS — BP 120/80 | HR 50 | Ht 76.0 in | Wt 176.0 lb

## 2022-10-25 DIAGNOSIS — E039 Hypothyroidism, unspecified: Secondary | ICD-10-CM

## 2022-10-25 DIAGNOSIS — I519 Heart disease, unspecified: Secondary | ICD-10-CM

## 2022-10-25 DIAGNOSIS — Z79899 Other long term (current) drug therapy: Secondary | ICD-10-CM

## 2022-10-25 DIAGNOSIS — I480 Paroxysmal atrial fibrillation: Secondary | ICD-10-CM | POA: Diagnosis not present

## 2022-10-25 DIAGNOSIS — Z7901 Long term (current) use of anticoagulants: Secondary | ICD-10-CM | POA: Diagnosis not present

## 2022-10-25 NOTE — Addendum Note (Signed)
Addended by: Roxanne Mins I on: 10/25/2022 02:43 PM   Modules accepted: Orders

## 2022-10-25 NOTE — Patient Instructions (Signed)
Medication Instructions:  Your physician recommends that you continue on your current medications as directed. Please refer to the Current Medication list given to you today.  *If you need a refill on your cardiac medications before your next appointment, please call your pharmacy*   Lab Work:  Your physician recommends that you return for lab work in:   Labs today: TSH T3 T4, CMP, CBC, Hbg A1c  If you have labs (blood work) drawn today and your tests are completely normal, you will receive your results only by: MyChart Message (if you have MyChart) OR A paper copy in the mail If you have any lab test that is abnormal or we need to change your treatment, we will call you to review the results.   Testing/Procedures: None   Follow-Up: At [facility], you and your health needs are our priority.  As part of our continuing mission to provide you with exceptional heart care, we have created designated Provider Care Teams.  These Care Teams include your primary Cardiologist (physician) and Advanced Practice Providers (APPs -  Physician Assistants and Nurse Practitioners) who all work together to provide you with the care you need, when you need it.  We recommend signing up for the patient portal called "MyChart".  Sign up information is provided on this After Visit Summary.  MyChart is used to connect with patients for Virtual Visits (Telemedicine).  Patients are able to view lab/test results, encounter notes, upcoming appointments, etc.  Non-urgent messages can be sent to your provider as well.   To learn more about what you can do with MyChart, go to [email or web address].    Your next appointment:   6 month(s)  Provider:   Norman Herrlich, MD    Other Instructions None

## 2022-10-26 ENCOUNTER — Other Ambulatory Visit: Payer: Self-pay

## 2022-10-26 LAB — COMPREHENSIVE METABOLIC PANEL
ALT: 13 IU/L (ref 0–44)
AST: 20 IU/L (ref 0–40)
Albumin/Globulin Ratio: 1.3 (ref 1.2–2.2)
Albumin: 4 g/dL (ref 3.7–4.7)
Alkaline Phosphatase: 62 IU/L (ref 44–121)
BUN/Creatinine Ratio: 16 (ref 10–24)
BUN: 24 mg/dL (ref 8–27)
Bilirubin Total: 0.3 mg/dL (ref 0.0–1.2)
CO2: 23 mmol/L (ref 20–29)
Calcium: 9.1 mg/dL (ref 8.6–10.2)
Chloride: 103 mmol/L (ref 96–106)
Creatinine, Ser: 1.48 mg/dL — ABNORMAL HIGH (ref 0.76–1.27)
Globulin, Total: 3 g/dL (ref 1.5–4.5)
Glucose: 87 mg/dL (ref 70–99)
Potassium: 4.8 mmol/L (ref 3.5–5.2)
Sodium: 139 mmol/L (ref 134–144)
Total Protein: 7 g/dL (ref 6.0–8.5)
eGFR: 45 mL/min/{1.73_m2} — ABNORMAL LOW (ref >59)

## 2022-10-26 LAB — CBC
Hematocrit: 35.8 % — ABNORMAL LOW (ref 37.5–51.0)
Hemoglobin: 12.7 g/dL — ABNORMAL LOW (ref 13.0–17.7)
MCH: 30.9 pg (ref 26.6–33.0)
MCHC: 35.5 g/dL (ref 31.5–35.7)
MCV: 87 fL (ref 79–97)
Platelets: 265 10*3/uL (ref 150–450)
RBC: 4.11 x10E6/uL — ABNORMAL LOW (ref 4.14–5.80)
RDW: 12.9 % (ref 11.6–15.4)
WBC: 5.9 10*3/uL (ref 3.4–10.8)

## 2022-10-26 LAB — TSH+T4F+T3FREE
Free T4: 1.99 ng/dL — ABNORMAL HIGH (ref 0.82–1.77)
T3, Free: 1.8 pg/mL — ABNORMAL LOW (ref 2.0–4.4)
TSH: 0.242 u[IU]/mL — ABNORMAL LOW (ref 0.450–4.500)

## 2022-10-26 LAB — HEMOGLOBIN A1C
Est. average glucose Bld gHb Est-mCnc: 128 mg/dL
Hgb A1c MFr Bld: 6.1 % — ABNORMAL HIGH (ref 4.8–5.6)

## 2022-10-26 MED ORDER — LEVOTHYROXINE SODIUM 75 MCG PO TABS: 75.0000 ug | ORAL_TABLET | Freq: Every day | ORAL | 3 refills | Status: AC

## 2022-10-26 NOTE — Progress Notes (Unsigned)
synthroid °

## 2022-10-27 DIAGNOSIS — D485 Neoplasm of uncertain behavior of skin: Secondary | ICD-10-CM | POA: Diagnosis not present

## 2022-10-27 DIAGNOSIS — L570 Actinic keratosis: Secondary | ICD-10-CM | POA: Diagnosis not present

## 2022-11-01 ENCOUNTER — Telehealth: Payer: Self-pay | Admitting: Cardiology

## 2022-11-01 NOTE — Telephone Encounter (Signed)
Patient states he is returning a call. 

## 2022-11-07 ENCOUNTER — Other Ambulatory Visit: Payer: Self-pay

## 2022-11-07 NOTE — Telephone Encounter (Signed)
Patient informed of results.  

## 2022-11-17 DIAGNOSIS — D044 Carcinoma in situ of skin of scalp and neck: Secondary | ICD-10-CM | POA: Diagnosis not present

## 2022-11-17 DIAGNOSIS — C44622 Squamous cell carcinoma of skin of right upper limb, including shoulder: Secondary | ICD-10-CM | POA: Diagnosis not present

## 2022-12-29 DIAGNOSIS — L570 Actinic keratosis: Secondary | ICD-10-CM | POA: Diagnosis not present

## 2022-12-29 DIAGNOSIS — C44622 Squamous cell carcinoma of skin of right upper limb, including shoulder: Secondary | ICD-10-CM | POA: Diagnosis not present

## 2023-04-26 DIAGNOSIS — Z461 Encounter for fitting and adjustment of hearing aid: Secondary | ICD-10-CM | POA: Insufficient documentation

## 2023-04-26 DIAGNOSIS — K08409 Partial loss of teeth, unspecified cause, unspecified class: Secondary | ICD-10-CM | POA: Insufficient documentation

## 2023-04-26 DIAGNOSIS — E039 Hypothyroidism, unspecified: Secondary | ICD-10-CM

## 2023-04-26 DIAGNOSIS — Z961 Presence of intraocular lens: Secondary | ICD-10-CM | POA: Insufficient documentation

## 2023-04-26 DIAGNOSIS — I480 Paroxysmal atrial fibrillation: Secondary | ICD-10-CM

## 2023-04-26 DIAGNOSIS — K08531 Fractured dental restorative material with loss of material: Secondary | ICD-10-CM | POA: Insufficient documentation

## 2023-04-26 DIAGNOSIS — Z7189 Other specified counseling: Secondary | ICD-10-CM | POA: Insufficient documentation

## 2023-04-26 HISTORY — DX: Hypothyroidism, unspecified: E03.9

## 2023-04-26 HISTORY — DX: Partial loss of teeth, unspecified cause, unspecified class: K08.409

## 2023-04-26 HISTORY — DX: Presence of intraocular lens: Z96.1

## 2023-04-26 HISTORY — DX: Other specified counseling: Z71.89

## 2023-04-26 HISTORY — DX: Encounter for fitting and adjustment of hearing aid: Z46.1

## 2023-04-26 HISTORY — DX: Fractured dental restorative material with loss of material: K08.531

## 2023-04-26 HISTORY — DX: Paroxysmal atrial fibrillation: I48.0

## 2023-04-28 NOTE — Progress Notes (Unsigned)
Cardiology Office Note:    Date:  04/30/2023   ID:  Kenneth Bradley, DOB 06/11/1930, MRN [id number]  PCP:  Buckner Malta, MD  Cardiologist:  Norman Herrlich, MD    Referring MD: Buckner Malta, MD    ASSESSMENT:    1. PAF (paroxysmal atrial fibrillation) (HCC)   2. On amiodarone therapy   3. Chronic anticoagulation   4. LV dysfunction    PLAN:    In order of problems listed above:  Remains in sinus rhythm continue low-dose amiodarone check labs for toxicity liver and thyroid and utilize a live monitor for 2 weeks, quite suspicious he is having symptomatic bradycardia If unrevealing appropriate for an implanted loop recorder Continue his anticoagulant Continue his Entresto   Next appointment: 6 weeks   Medication Adjustments/Labs and Tests Ordered: Current medicines are reviewed at length with the patient today.  Concerns regarding medicines are outlined above.  Orders Placed This Encounter  Procedures   CBC   Comp Met (CMET)   TSH+T4F+T3Free   LONG TERM MONITOR-LIVE TELEMETRY (3-14 DAYS)   EKG 12-Lead   No orders of the defined types were placed in this encounter.    History of Present Illness:    Kenneth Bradley is a 89 y.o. male with a hx of paroxysmal atrial fibrillation maintaining sinus rhythm with low-dose amiodarone amiodarone induced hypothyroidism chronic anticoagulation and cardiomyopathy EF of 40 to 45% associated with his atrial fibrillation last seen 10/25/2022.  Compliance with diet, lifestyle and medications: Yes  He has had 2 episodes interpreted as uncontrolled hypertension but the more speak to him it was marked weakness and one of them was near loss of consciousness I am quite concerned about bradycardia Your remain on amiodarone will use a 2-week live monitor ZIO AT and if he does not have another episode I think from a safety perspective he should have an implanted loop recorder In between episodes he really feels well he is active and  is not having exercise intolerance edema shortness of breath chest pain or palpitation He does not have a smart watch At the episodes his blood pressure in the range of 170 systolic Past Medical History:  Diagnosis Date   BPH (benign prostatic hyperplasia)    Eosinophil count raised 12/13/2020   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   levothyroxine (SYNTHROID) 75 MCG tablet Take 1 tablet (75 mcg total) by mouth daily before breakfast.   rivaroxaban (XARELTO) 20 MG TABS tablet TAKE 1 TABLET DAILY WITH SUPPER (Patient taking differently: Take 20 mg by mouth daily with supper. TAKE 1 TABLET DAILY WITH SUPPER)   sacubitril-valsartan (ENTRESTO) 24-26 MG Take 1 tablet by mouth 2 (two) times daily.      EKGs/Labs/Other Studies Reviewed:    The following studies were reviewed today:  Cardiac Studies & Procedures       ECHOCARDIOGRAM  ECHOCARDIOGRAM COMPLETE 12/29/2021  Narrative ECHOCARDIOGRAM REPORT    Patient Name:   Kenneth Bradley Date of Exam: 12/29/2021 Medical Rec #:  [id number]        Height:       76.0 in Accession #:    [id number]       Weight:       178.0 lb Date of  Birth:  1931/01/28        BSA:          2.108 m Patient Age:    89 years         BP:           140/80 mmHg Patient Gender: M                HR:           52 bpm. Exam Location:  [facility]  Procedure: 2D Echo, Cardiac Doppler, Color Doppler and Strain Analysis  Indications:    PAF (paroxysmal atrial fibrillation) (HCC) [I48.0 (ICD-10-CM)]; On amiodarone therapy [Z79.899 (ICD-10-CM)]; Chronic anticoagulation [Z79.01 (ICD-10-CM)]; Other cardiomyopathy (HCC) [I42.8 (ICD-10-CM)]; Acquired hypothyroidism [E03.9 (ICD-10-CM)]  History:        Patient has prior history  of Echocardiogram examinations, most recent 11/30/2020.  Sonographer:    Louie Boston RDCS Referring Phys: [id number] Karmyn Lowman J Patton Rabinovich  IMPRESSIONS   1. Left ventricular ejection fraction, by estimation, is 60 to 65%. The left ventricle has normal function. The left ventricle has no regional wall motion abnormalities. The left ventricular internal cavity size was mildly dilated. There is mild left ventricular hypertrophy. Left ventricular diastolic parameters are consistent with Grade I diastolic dysfunction (impaired relaxation). 2. Right ventricular systolic function is normal. The right ventricular size is normal. There is mildly elevated pulmonary artery systolic pressure. 3. Left atrial size was severely dilated. 4. Right atrial size was severely dilated. 5. The mitral valve is normal in structure. No evidence of mitral valve regurgitation. No evidence of mitral stenosis. 6. The aortic valve is normal in structure. There is mild calcification of the aortic valve. There is mild thickening of the aortic valve. Aortic valve regurgitation is trivial. Mild aortic valve stenosis. Aortic valve area, by VTI measures 1.63 cm. Aortic valve mean gradient measures 11.0 mmHg. 7. Aneurysm of the ascending aorta, measuring 41 mm. 8. The inferior vena cava is normal in size with greater than 50% respiratory variability, suggesting right atrial pressure of 3 mmHg.  FINDINGS Left Ventricle: Left ventricular ejection fraction, by estimation, is 60 to 65%. The left ventricle has normal function. The left ventricle has no regional wall motion abnormalities. The left ventricular internal cavity size was mildly dilated. There is mild left ventricular hypertrophy. Left ventricular diastolic parameters are consistent with Grade I diastolic dysfunction (impaired relaxation).  Right Ventricle: The right ventricular size is normal. No increase in right ventricular wall thickness. Right ventricular systolic function is  normal. There is mildly elevated pulmonary artery systolic pressure. The tricuspid regurgitant velocity is 2.69 m/s, and with an assumed right atrial pressure of 8 mmHg, the estimated right ventricular systolic pressure is 36.9 mmHg.  Left Atrium: Left atrial size was severely dilated.  Right Atrium: Right atrial size was severely dilated.  Pericardium: There is no evidence of pericardial effusion.  Mitral Valve: The mitral valve is normal in structure. No evidence of mitral valve regurgitation. No evidence of mitral valve stenosis.  Tricuspid Valve: The tricuspid valve is normal in structure. Tricuspid valve regurgitation is trivial. No evidence of tricuspid stenosis.  Aortic Valve: The aortic valve is normal in structure. There is mild calcification of the aortic valve. There is mild thickening of the aortic valve. Aortic valve regurgitation is trivial. Mild aortic stenosis is present. Aortic valve mean gradient measures 11.0 mmHg. Aortic valve peak gradient measures 19.4 mmHg. Aortic valve area, by VTI measures 1.63 cm.  Pulmonic Valve: The pulmonic valve was normal  in structure. Pulmonic valve regurgitation is mild. No evidence of pulmonic stenosis.  Aorta: The aortic root is normal in size and structure. There is an aneurysm involving the ascending aorta measuring 41 mm.  Venous: The inferior vena cava is normal in size with greater than 50% respiratory variability, suggesting right atrial pressure of 3 mmHg.  IAS/Shunts: No atrial level shunt detected by color flow Doppler.   LEFT VENTRICLE PLAX 2D LVIDd:         6.30 cm   Diastology LVIDs:         4.30 cm   LV e' medial:    5.22 cm/s LV PW:         1.20 cm   LV E/e' medial:  10.9 LV IVS:        1.20 cm   LV e' lateral:   3.73 cm/s LVOT diam:     2.10 cm   LV E/e' lateral: 15.2 LV SV:         85 LV SV Index:   40 LVOT Area:     3.46 cm   RIGHT VENTRICLE             IVC RV S prime:     12.60 cm/s  IVC diam: 2.30 cm TAPSE  (M-mode): 2.5 cm  LEFT ATRIUM              Index        RIGHT ATRIUM           Index LA diam:        5.20 cm  2.47 cm/m   RA Area:     32.90 cm LA Vol (A2C):   96.0 ml  45.54 ml/m  RA Volume:   116.00 ml 55.02 ml/m LA Vol (A4C):   106.0 ml 50.28 ml/m LA Biplane Vol: 101.0 ml 47.91 ml/m AORTIC VALVE                     PULMONIC VALVE AV Area (Vmax):    1.40 cm      PR End Diast Vel: 2.92 msec AV Area (Vmean):   1.33 cm AV Area (VTI):     1.63 cm AV Vmax:           220.50 cm/s AV Vmean:          157.000 cm/s AV VTI:            0.520 m AV Peak Grad:      19.4 mmHg AV Mean Grad:      11.0 mmHg LVOT Vmax:         89.40 cm/s LVOT Vmean:        60.400 cm/s LVOT VTI:          0.244 m LVOT/AV VTI ratio: 0.47  AORTA Ao Root diam:  3.70 cm Ao Sinus diam: 3.80 cm Ao STJ diam:   3.6 cm Ao Asc diam:   4.05 cm Ao Desc diam:  2.40 cm  MITRAL VALVE                  TRICUSPID VALVE MV Area (PHT): 3.16 cm       TR Peak grad:   28.9 mmHg MV Decel Time: 240 msec       TR Vmax:        269.00 cm/s MR Peak grad:    125.4 mmHg MR Mean grad:    85.0 mmHg    SHUNTS MR Vmax:  560.00 cm/s  Systemic VTI:  0.24 m MR Vmean:        435.0 cm/s   Systemic Diam: 2.10 cm MR PISA:         4.02 cm MR PISA Eff ROA: 25 mm MR PISA Radius:  0.80 cm MV E velocity: 56.80 cm/s MV A velocity: 60.60 cm/s MV E/A ratio:  0.94  Gypsy Balsam MD Electronically signed by Gypsy Balsam MD Signature Date/Time: 12/29/2021/6:00:00 PM    Final    MONITORS  LONG TERM MONITOR (3-14 DAYS) 08/04/2021  Narrative Patch Wear Time:  7 days and 2 hours (2023-02-14T15:13:08-498 to 2023-02-21T17:44:20-0500)  Patient had a min HR of 41 bpm, max HR of 84 bpm, and avg HR of 52 bpm. Predominant underlying rhythm was Sinus Rhythm. Slight P wave morphology changes were noted.  There were 4 triggered events all sinus bradycardia heart rate 52 to 60 bpm with no ectopy.  Isolated SVEs were rare (<1.0%), and no  SVE Couplets or SVE Triplets were present.  There were no episodes of atrial fibrillation or flutter  Isolated VEs were rare (<1.0%), and no VE Couplets or VE Triplets were present. Ventricular Trigeminy was present.   Unremarkable 7-day monitor triggered events were associated with sinus bradycardia to a moderate degree.           EKG Interpretation Date/Time:  Monday April 30 2023 16:21:35 EST Ventricular Rate:  55 PR Interval:  178 QRS Duration:  102 QT Interval:  478 QTC Calculation: 457 R Axis:   16  Text Interpretation: Sinus bradycardia Nonspecific ST abnormality No previous ECGs available Confirmed by Norman Herrlich ([id number]) on 04/30/2023 4:38:35 PM   Recent Labs: 10/25/2022: ALT 13; BUN 24; Creatinine, Ser 1.48; Hemoglobin 12.7; Platelets 265; Potassium 4.8; Sodium 139; TSH 0.242  Recent Lipid Panel No results found for: "CHOL", "TRIG", "HDL", "CHOLHDL", "VLDL", "LDLCALC", "LDLDIRECT"  Physical Exam:    VS:  BP 134/70   Pulse (!) 55   Ht 6\' 4"  (1.93 m)   Wt 184 lb 9.6 oz (83.7 kg)   SpO2 98%   BMI 22.47 kg/m     Wt Readings from Last 3 Encounters:  04/30/23 184 lb 9.6 oz (83.7 kg)  10/25/22 176 lb (79.8 kg)  04/20/22 188 lb (85.3 kg)     GEN: Looks younger than his age well nourished, well developed in no acute distress HEENT: Normal NECK: No JVD; No carotid bruits LYMPHATICS: No lymphadenopathy CARDIAC: RRR, no murmurs, rubs, gallops RESPIRATORY:  Clear to auscultation without rales, wheezing or rhonchi  ABDOMEN: Soft, non-tender, non-distended MUSCULOSKELETAL:  No edema; No deformity  SKIN: Warm and dry NEUROLOGIC:  Alert and oriented x 3 PSYCHIATRIC:  Normal affect    Signed, Norman Herrlich, MD  04/30/2023 5:10 PM    [facility]

## 2023-04-30 ENCOUNTER — Telehealth: Payer: Self-pay | Admitting: Cardiology

## 2023-04-30 ENCOUNTER — Ambulatory Visit: Payer: Medicare Other | Attending: Cardiology

## 2023-04-30 ENCOUNTER — Ambulatory Visit: Payer: Medicare Other | Attending: Cardiology | Admitting: Cardiology

## 2023-04-30 ENCOUNTER — Encounter: Payer: Self-pay | Admitting: Cardiology

## 2023-04-30 VITALS — BP 134/70 | HR 55 | Ht 76.0 in | Wt 184.6 lb

## 2023-04-30 DIAGNOSIS — Z79899 Other long term (current) drug therapy: Secondary | ICD-10-CM | POA: Diagnosis not present

## 2023-04-30 DIAGNOSIS — Z7901 Long term (current) use of anticoagulants: Secondary | ICD-10-CM | POA: Diagnosis not present

## 2023-04-30 DIAGNOSIS — I519 Heart disease, unspecified: Secondary | ICD-10-CM | POA: Insufficient documentation

## 2023-04-30 DIAGNOSIS — I480 Paroxysmal atrial fibrillation: Secondary | ICD-10-CM | POA: Insufficient documentation

## 2023-04-30 NOTE — Telephone Encounter (Signed)
Patient states he will come in tomorrow and schedule his 6 week follow up when he gets his labs done

## 2023-04-30 NOTE — Patient Instructions (Signed)
Medication Instructions:  Your physician recommends that you continue on your current medications as directed. Please refer to the Current Medication list given to you today.  *If you need a refill on your cardiac medications before your next appointment, please call your pharmacy*   Lab Work: Your physician recommends that you return for lab work in:   Labs today: CBC, CMP, TSH T3 T4  If you have labs (blood work) drawn today and your tests are completely normal, you will receive your results only by: MyChart Message (if you have MyChart) OR A paper copy in the mail If you have any lab test that is abnormal or we need to change your treatment, we will call you to review the results.   Testing/Procedures: A zio monitor was ordered today. It will remain on for 14 days. You will then return monitor and event diary in provided box. It takes 1-2 weeks for report to be downloaded and returned to [place]. We will call you with the results. If monitor falls off or has orange flashing light, please call Zio for further instructions.     Follow-Up: At [facility], you and your health needs are our priority.  As part of our continuing mission to provide you with exceptional heart care, we have created designated Provider Care Teams.  These Care Teams include your primary Cardiologist (physician) and Advanced Practice Providers (APPs -  Physician Assistants and Nurse Practitioners) who all work together to provide you with the care you need, when you need it.  We recommend signing up for the patient portal called "MyChart".  Sign up information is provided on this After Visit Summary.  MyChart is used to connect with patients for Virtual Visits (Telemedicine).  Patients are able to view lab/test results, encounter notes, upcoming appointments, etc.  Non-urgent messages can be sent to your provider as well.   To learn more about what you can do with MyChart, go to [email or web address].     Your next appointment:   6 week(s)  Provider:   Wallis Bamberg, NP   Other Instructions None

## 2023-05-01 DIAGNOSIS — Z79899 Other long term (current) drug therapy: Secondary | ICD-10-CM | POA: Diagnosis not present

## 2023-05-01 DIAGNOSIS — I480 Paroxysmal atrial fibrillation: Secondary | ICD-10-CM | POA: Diagnosis not present

## 2023-05-02 DIAGNOSIS — I480 Paroxysmal atrial fibrillation: Secondary | ICD-10-CM | POA: Diagnosis not present

## 2023-05-02 DIAGNOSIS — Z79899 Other long term (current) drug therapy: Secondary | ICD-10-CM | POA: Diagnosis not present

## 2023-05-08 LAB — CBC
Hematocrit: 38.7 % (ref 37.5–51.0)
Hemoglobin: 12.7 g/dL — ABNORMAL LOW (ref 13.0–17.7)
MCH: 30.2 pg (ref 26.6–33.0)
MCHC: 32.8 g/dL (ref 31.5–35.7)
MCV: 92 fL (ref 79–97)
Platelets: 259 10*3/uL (ref 150–450)
RBC: 4.2 x10E6/uL (ref 4.14–5.80)
RDW: 13.5 % (ref 11.6–15.4)
WBC: 5.9 10*3/uL (ref 3.4–10.8)

## 2023-05-08 LAB — COMPREHENSIVE METABOLIC PANEL
ALT: 16 [IU]/L (ref 0–44)
AST: 28 [IU]/L (ref 0–40)
Albumin: 4.2 g/dL (ref 3.6–4.6)
Alkaline Phosphatase: 64 [IU]/L (ref 44–121)
BUN/Creatinine Ratio: 15 (ref 10–24)
BUN: 20 mg/dL (ref 10–36)
Bilirubin Total: 0.5 mg/dL (ref 0.0–1.2)
CO2: 24 mmol/L (ref 20–29)
Calcium: 9 mg/dL (ref 8.6–10.2)
Chloride: 104 mmol/L (ref 96–106)
Creatinine, Ser: 1.34 mg/dL — ABNORMAL HIGH (ref 0.76–1.27)
Globulin, Total: 2.7 g/dL (ref 1.5–4.5)
Glucose: 91 mg/dL (ref 70–99)
Potassium: 5 mmol/L (ref 3.5–5.2)
Sodium: 140 mmol/L (ref 134–144)
Total Protein: 6.9 g/dL (ref 6.0–8.5)
eGFR: 50 mL/min/{1.73_m2} — ABNORMAL LOW (ref >59)

## 2023-05-08 LAB — TSH+T4F+T3FREE
T3, Free: 1.9 pg/mL — ABNORMAL LOW (ref 2.0–4.4)
TSH: 2.64 u[IU]/mL (ref 0.450–4.500)

## 2023-05-09 ENCOUNTER — Other Ambulatory Visit: Payer: Self-pay

## 2023-05-09 DIAGNOSIS — Z79899 Other long term (current) drug therapy: Secondary | ICD-10-CM

## 2023-05-10 DIAGNOSIS — Z79899 Other long term (current) drug therapy: Secondary | ICD-10-CM | POA: Diagnosis not present

## 2023-05-11 LAB — TSH+T4F+T3FREE
Free T4: 1.58 ng/dL (ref 0.82–1.77)
T3, Free: 1.6 pg/mL — ABNORMAL LOW (ref 2.0–4.4)
TSH: 3.24 u[IU]/mL (ref 0.450–4.500)

## 2023-06-12 ENCOUNTER — Encounter: Payer: Self-pay | Admitting: Cardiology

## 2023-06-12 DIAGNOSIS — I429 Cardiomyopathy, unspecified: Secondary | ICD-10-CM

## 2023-06-12 HISTORY — DX: Cardiomyopathy, unspecified: I42.9

## 2023-06-13 NOTE — Progress Notes (Signed)
 Cardiology Office Note:  .   Date:  06/14/2023  ID:  Kenneth Bradley, DOB [birth year], MRN [id number] PCP: Clemmie Nest, MD   HeartCare Providers Cardiologist:  Redell Leiter, MD    History of Present Illness: .   Kenneth Bradley is a 89 y.o. male with a past medical history of PAF on amiodarone  and Xarelto , HFrEF, BPH.  04/30/2023 monitor average heart rate of 52 bpm, predominantly in sinus rhythm, 18 triggered events associated with a single PVC 12/29/2021 echo EF 60 to 65%, mild LVH, grade 1 DD, mildly elevated PASP, severe biatrial enlargement, mild thickening of the aortic valve, aneurysm of the ascending aorta measuring 41 mm 07/19/2021 monitor average heart rate 52 bpm, predominant rhythm was sinus, 4 triggered events associated with sinus bradycardia 01/07/2021 successful DCCV x 1  Most recently evaluated by Dr. Leiter on 04/30/2023, maintaining sinus rhythm however he was bradycardic along with weakness and near loss of consciousness.  A live monitor was arranged which revealed an average heart rate of 52 bpm, predominantly in sinus rhythm, 18 triggered events associated with a single PVC.  He presents today for follow-up after recently wearing monitor.  Overall, the monitor was not revealing for contributory causes of his episodes of profound weakness.  We did discuss the recommendations per Dr. Leiter for loop recorder however, he has not had any further episodes--only 2 over the last year, and he is not inclined to proceed with this.  He stays very active, lives by himself, very independent. He denies chest pain, palpitations, dyspnea, pnd, orthopnea, n, v, dizziness, syncope, edema, weight gain, or early satiety.  He follows laterally with cardiologist at the [facility].  ROS: Review of Systems  Neurological:  Positive for dizziness.     Studies Reviewed: .        Cardiac Studies & Procedures      ECHOCARDIOGRAM  ECHOCARDIOGRAM COMPLETE  12/29/2021  Narrative ECHOCARDIOGRAM REPORT    Patient Name:   Kenneth Bradley Date of Exam: 12/29/2021 Medical Rec #:  [id number]        Height:       76.0 in Accession #:    [id number]       Weight:       178.0 lb Date of Birth:  [birth year]        BSA:          2.108 m Patient Age:    89 years         BP:           140/80 mmHg Patient Gender: M                HR:           52 bpm. Exam Location:  [place]  Procedure: 2D Echo, Cardiac Doppler, Color Doppler and Strain Analysis  Indications:    PAF (paroxysmal atrial fibrillation) (HCC) [I48.0 (ICD-10-CM)]; On amiodarone  therapy [Z79.899 (ICD-10-CM)]; Chronic anticoagulation [Z79.01 (ICD-10-CM)]; Other cardiomyopathy (HCC) [I42.8 (ICD-10-CM)]; Acquired hypothyroidism [E03.9 (ICD-10-CM)]  History:        Patient has prior history of Echocardiogram examinations, most recent 11/30/2020.  Sonographer:    Lynwood Silvas RDCS Referring Phys: [id number] BRIAN J MUNLEY  IMPRESSIONS   1. Left ventricular ejection fraction, by estimation, is 60 to 65%. The left ventricle has normal function. The left ventricle has no regional wall motion abnormalities. The left ventricular internal cavity size was mildly dilated. There is mild left ventricular hypertrophy. Left ventricular diastolic parameters are consistent  with Grade I diastolic dysfunction (impaired relaxation). 2. Right ventricular systolic function is normal. The right ventricular size is normal. There is mildly elevated pulmonary artery systolic pressure. 3. Left atrial size was severely dilated. 4. Right atrial size was severely dilated. 5. The mitral valve is normal in structure. No evidence of mitral valve regurgitation. No evidence of mitral stenosis. 6. The aortic valve is normal in structure. There is mild calcification of the aortic valve. There is mild thickening of the aortic valve. Aortic valve regurgitation is trivial. Mild aortic valve stenosis. Aortic valve area, by VTI measures  1.63 cm. Aortic valve mean gradient measures 11.0 mmHg. 7. Aneurysm of the ascending aorta, measuring 41 mm. 8. The inferior vena cava is normal in size with greater than 50% respiratory variability, suggesting right atrial pressure of 3 mmHg.  FINDINGS Left Ventricle: Left ventricular ejection fraction, by estimation, is 60 to 65%. The left ventricle has normal function. The left ventricle has no regional wall motion abnormalities. The left ventricular internal cavity size was mildly dilated. There is mild left ventricular hypertrophy. Left ventricular diastolic parameters are consistent with Grade I diastolic dysfunction (impaired relaxation).  Right Ventricle: The right ventricular size is normal. No increase in right ventricular wall thickness. Right ventricular systolic function is normal. There is mildly elevated pulmonary artery systolic pressure. The tricuspid regurgitant velocity is 2.69 m/s, and with an assumed right atrial pressure of 8 mmHg, the estimated right ventricular systolic pressure is 36.9 mmHg.  Left Atrium: Left atrial size was severely dilated.  Right Atrium: Right atrial size was severely dilated.  Pericardium: There is no evidence of pericardial effusion.  Mitral Valve: The mitral valve is normal in structure. No evidence of mitral valve regurgitation. No evidence of mitral valve stenosis.  Tricuspid Valve: The tricuspid valve is normal in structure. Tricuspid valve regurgitation is trivial. No evidence of tricuspid stenosis.  Aortic Valve: The aortic valve is normal in structure. There is mild calcification of the aortic valve. There is mild thickening of the aortic valve. Aortic valve regurgitation is trivial. Mild aortic stenosis is present. Aortic valve mean gradient measures 11.0 mmHg. Aortic valve peak gradient measures 19.4 mmHg. Aortic valve area, by VTI measures 1.63 cm.  Pulmonic Valve: The pulmonic valve was normal in structure. Pulmonic valve  regurgitation is mild. No evidence of pulmonic stenosis.  Aorta: The aortic root is normal in size and structure. There is an aneurysm involving the ascending aorta measuring 41 mm.  Venous: The inferior vena cava is normal in size with greater than 50% respiratory variability, suggesting right atrial pressure of 3 mmHg.  IAS/Shunts: No atrial level shunt detected by color flow Doppler.   LEFT VENTRICLE PLAX 2D LVIDd:         6.30 cm   Diastology LVIDs:         4.30 cm   LV e' medial:    5.22 cm/s LV PW:         1.20 cm   LV E/e' medial:  10.9 LV IVS:        1.20 cm   LV e' lateral:   3.73 cm/s LVOT diam:     2.10 cm   LV E/e' lateral: 15.2 LV SV:         85 LV SV Index:   40 LVOT Area:     3.46 cm   RIGHT VENTRICLE             IVC RV S prime:  12.60 cm/s  IVC diam: 2.30 cm TAPSE (M-mode): 2.5 cm  LEFT ATRIUM              Index        RIGHT ATRIUM           Index LA diam:        5.20 cm  2.47 cm/m   RA Area:     32.90 cm LA Vol (A2C):   96.0 ml  45.54 ml/m  RA Volume:   116.00 ml 55.02 ml/m LA Vol (A4C):   106.0 ml 50.28 ml/m LA Biplane Vol: 101.0 ml 47.91 ml/m AORTIC VALVE                     PULMONIC VALVE AV Area (Vmax):    1.40 cm      PR End Diast Vel: 2.92 msec AV Area (Vmean):   1.33 cm AV Area (VTI):     1.63 cm AV Vmax:           220.50 cm/s AV Vmean:          157.000 cm/s AV VTI:            0.520 m AV Peak Grad:      19.4 mmHg AV Mean Grad:      11.0 mmHg LVOT Vmax:         89.40 cm/s LVOT Vmean:        60.400 cm/s LVOT VTI:          0.244 m LVOT/AV VTI ratio: 0.47  AORTA Ao Root diam:  3.70 cm Ao Sinus diam: 3.80 cm Ao STJ diam:   3.6 cm Ao Asc diam:   4.05 cm Ao Desc diam:  2.40 cm  MITRAL VALVE                  TRICUSPID VALVE MV Area (PHT): 3.16 cm       TR Peak grad:   28.9 mmHg MV Decel Time: 240 msec       TR Vmax:        269.00 cm/s MR Peak grad:    125.4 mmHg MR Mean grad:    85.0 mmHg    SHUNTS MR Vmax:         560.00 cm/s   Systemic VTI:  0.24 m MR Vmean:        435.0 cm/s   Systemic Diam: 2.10 cm MR PISA:         4.02 cm MR PISA Eff ROA: 25 mm MR PISA Radius:  0.80 cm MV E velocity: 56.80 cm/s MV A velocity: 60.60 cm/s MV E/A ratio:  0.94  Lamar Fitch MD Electronically signed by Lamar Fitch MD Signature Date/Time: 12/29/2021/6:00:00 PM    Final   MONITORS  LONG TERM MONITOR-LIVE TELEMETRY (3-14 DAYS) 05/23/2023  Narrative Patch Wear Time:  13 days and 14 hours (2024-11-25T17:16:25-0500 to 2024-12-09T07:40:25-0500)  Patient had a min HR of 38 bpm, max HR of 124 bpm, and avg HR of 52 bpm. Predominant underlying rhythm was Sinus Rhythm.  There were 18 triggered events all sinus rhythm 1 had a single PVC.  There were no sinus pauses of 3 seconds or greater and no episodes of second or third-degree AV block.  There were no episodes of atrial fibrillation or flutter.  3 brief APC runs occurred, the run with the fastest interval lasting 6 beats with a max rate of 124 bpm (avg 108 bpm); the run with the  fastest interval was also the longest.  Isolated SVEs were rare (<1.0%), SVE Couplets were rare (<1.0%), and SVE Triplets were rare (<1.0%).  Isolated VEs were rare (<1.0%), and no VE Couplets or VE Triplets were present.           Risk Assessment/Calculations:    CHA2DS2-VASc Score = 4   This indicates a 4.8% annual risk of stroke. The patient's score is based upon: CHF History: 1 HTN History: 1 Diabetes History: 0 Stroke History: 0 Vascular Disease History: 0 Age Score: 2 Gender Score: 0            Physical Exam:   VS:  BP 134/76   Pulse (!) 51   Ht 6' 4 (1.93 m)   Wt 186 lb 9.6 oz (84.6 kg)   SpO2 99%   BMI 22.71 kg/m    Wt Readings from Last 3 Encounters:  06/14/23 186 lb 9.6 oz (84.6 kg)  04/30/23 184 lb 9.6 oz (83.7 kg)  10/25/22 176 lb (79.8 kg)    GEN: Well nourished, well developed in no acute distress NECK: No JVD; No carotid bruits CARDIAC: RRR, no  murmurs, rubs, gallops RESPIRATORY:  Clear to auscultation without rales, wheezing or rhonchi  ABDOMEN: Soft, non-tender, non-distended EXTREMITIES:  No edema; No deformity   ASSESSMENT AND PLAN: .   PAF/Hypercoagulable state/on amiodarone  therapy -CHA2DS2-VASc score of 4, heart rate is 52 today.  Currently on Xarelto  20 mg daily--dose reduction is indicated as his creatinine clearance is 34.  Will start Xarelto  15 mg daily.  Continue amiodarone  200 mg daily-as most recent thyroid  panel in November was normal, LFTs normal.  Denies hematochezia, hematuria, mopped assist.  HFrEF-NYHA class I, euvolemic.  Most recent echo in 2023 revealed normalization of his EF.  Continue Entresto  24-26 mg twice daily.  Not on a beta-blocker secondary to bradycardia.  Dizziness -he has not had any further episodes, repeat monitor was noncontributory for causes.  We did discuss a loop recorder implantation however he declines at this time as he has not been bothered by further episodes of dizziness.  Bradycardia-asymptomatic.       Dispo: He follows laterally with a cardiologist at the [facility], would like to push his appointments out further with us .  Will have him follow-up in 9 months.  Decrease Xarelto  to 15 mg daily.  Signed, Delon JAYSON Hoover, NP

## 2023-06-14 ENCOUNTER — Telehealth: Payer: Self-pay | Admitting: Cardiology

## 2023-06-14 ENCOUNTER — Encounter: Payer: Self-pay | Admitting: Cardiology

## 2023-06-14 ENCOUNTER — Ambulatory Visit: Payer: Medicare Other | Attending: Cardiology | Admitting: Cardiology

## 2023-06-14 VITALS — BP 134/76 | HR 51 | Ht 76.0 in | Wt 186.6 lb

## 2023-06-14 DIAGNOSIS — Z7901 Long term (current) use of anticoagulants: Secondary | ICD-10-CM

## 2023-06-14 DIAGNOSIS — R42 Dizziness and giddiness: Secondary | ICD-10-CM | POA: Diagnosis present

## 2023-06-14 DIAGNOSIS — R001 Bradycardia, unspecified: Secondary | ICD-10-CM

## 2023-06-14 DIAGNOSIS — I5020 Unspecified systolic (congestive) heart failure: Secondary | ICD-10-CM

## 2023-06-14 DIAGNOSIS — I480 Paroxysmal atrial fibrillation: Secondary | ICD-10-CM | POA: Diagnosis present

## 2023-06-14 DIAGNOSIS — Z79899 Other long term (current) drug therapy: Secondary | ICD-10-CM

## 2023-06-14 NOTE — Patient Instructions (Signed)
Medication Instructions:  Your physician recommends that you continue on your current medications as directed. Please refer to the Current Medication list given to you today.  *If you need a refill on your cardiac medications before your next appointment, please call your pharmacy*   Lab Work: NONE If you have labs (blood work) drawn today and your tests are completely normal, you will receive your results only by: [place] (if you have MyChart) OR A paper copy in the mail If you have any lab test that is abnormal or we need to change your treatment, we will call you to review the results.   Testing/Procedures: NONE   Follow-Up: At [facility], you and your health needs are our priority.  As part of our continuing mission to provide you with exceptional heart care, we have created designated Provider Care Teams.  These Care Teams include your primary Cardiologist (physician) and Advanced Practice Providers (APPs -  Physician Assistants and Nurse Practitioners) who all work together to provide you with the care you need, when you need it.  We recommend signing up for the patient portal called "MyChart".  Sign up information is provided on this After Visit Summary.  MyChart is used to connect with patients for Virtual Visits (Telemedicine).  Patients are able to view lab/test results, encounter notes, upcoming appointments, etc.  Non-urgent messages can be sent to your provider as well.   To learn more about what you can do with MyChart, go to [email or web address].    Your next appointment:   9 month(s)  Provider:   Shirlee More, MD    Other Instructions

## 2023-06-14 NOTE — Telephone Encounter (Signed)
 Kenneth Bradley's note :please let him know I reviewed his lab work that he had completed at the [place], and based on his most recent kidney function, as well as his age, we need to decrease his Xarelto  dose to 15 mg daily.   Left message for patient to return call

## 2023-06-18 ENCOUNTER — Telehealth: Payer: Self-pay | Admitting: *Deleted

## 2023-06-18 MED ORDER — RIVAROXABAN 15 MG PO TABS: 15.0000 mg | ORAL_TABLET | Freq: Every day | ORAL | 2 refills | Status: DC

## 2023-06-18 NOTE — Telephone Encounter (Signed)
 Results reviewed with pt as per Wallis Bamberg NP's note.  Pt verbalized understanding and had no additional questions. Routed to PCP.

## 2023-06-18 NOTE — Telephone Encounter (Signed)
 Received notice from Dept of [facility] stating pt does not have authorization for [employer]. We are to send prescriptions to an outside non-[facility] pharmacy. Left message for pt to call back and verify what pharmacy he wants prescriptions sent to.

## 2023-06-18 NOTE — Addendum Note (Signed)
 Addended by: Lonia Farber on: 06/18/2023 09:32 AM   Modules accepted: Orders

## 2023-06-28 NOTE — Telephone Encounter (Signed)
Spoke with pt who let me know that we have to print out his Rx's and fax them to his [place] doctor. He didn't have the fax number with him so will stop by our office and bring the number so that we can get his Rx's filled. Pt verbalized understanding and had no further questions.

## 2023-07-02 ENCOUNTER — Telehealth: Payer: Self-pay | Admitting: Cardiology

## 2023-07-02 MED ORDER — RIVAROXABAN 15 MG PO TABS: 15.0000 mg | ORAL_TABLET | Freq: Every day | ORAL | 3 refills | Status: AC

## 2023-07-02 NOTE — Telephone Encounter (Signed)
*  STAT* If patient is at the pharmacy, call can be transferred to refill team.   1. Which medications need to be refilled? (please list name of each medication and dose if known)  Rivaroxaban (XARELTO) 15 MG TABS tablet    2. Which pharmacy/location (including street and city if local pharmacy) is medication to be sent to? Patient states Dr. Dulce Sellar isn't registered as a prescribed through the [facility], so the medication will need to go through [facility] doctor. Patient would like to have medication faxed to PCP, Charise Carwin, PA at fax# [phone].  3. Do they need a 30 day or 90 day supply?   90 day supply

## 2023-07-02 NOTE — Telephone Encounter (Signed)
Printed Rx for Xarelto 15 mg, #90 with 3 refills. Had Dr. Dulce Sellar sign and faxed to [facility], [place], pt's pcp. She will send it to the [place] per pt. This information is in the Specialty Comments of pt's chart.

## 2023-11-16 DIAGNOSIS — H1032 Unspecified acute conjunctivitis, left eye: Secondary | ICD-10-CM | POA: Diagnosis not present

## 2023-11-16 DIAGNOSIS — J329 Chronic sinusitis, unspecified: Secondary | ICD-10-CM | POA: Diagnosis not present

## 2024-04-15 NOTE — Progress Notes (Unsigned)
 Cardiology Office Note:    Date:  04/16/2024   ID:  Kenneth Bradley, DOB 1930/10/26, MRN [id number]  PCP:  Kenneth Nest, MD  Cardiologist:  Kenneth Leiter, MD    Referring MD: Kenneth Nest, MD    ASSESSMENT:    1. PAF (paroxysmal atrial fibrillation) (HCC)   2. On amiodarone  therapy   3. Chronic anticoagulation   4. LV dysfunction   5. Acquired hypothyroidism    PLAN:    In order of problems listed above:  Kenneth Bradley is doing well tolerates without toxicity and maintain sinus rhythm with low-dose amiodarone  and continue his current anticoagulant. Safety check labs today including renal function and thyroid  panel He is on thyroid  replacement therapy from the [facility] the dose looks appropriate for his age group Continue Entresto  with his LV dysfunction Initiate lipid-lowering treatment   Next appointment: 66-month follow-up for atrial fibrillation amiodarone    Medication Adjustments/Labs and Tests Ordered: Current medicines are reviewed at length with the patient today.  Concerns regarding medicines are outlined above.  Orders Placed This Encounter  Procedures   Comp Met (CMET)   TSH+T4F+T3Free   EKG 12-Lead   Meds ordered this encounter  Medications   DISCONTD: pravastatin (PRAVACHOL) 20 MG tablet    Sig: Take 1 tablet (20 mg total) by mouth daily.    Dispense:  90 tablet    Refill:  3     History of Present Illness:    Kenneth Bradley is a 89 y.o. male with a hx of paroxysmal atrial fibrillation maintaining sinus rhythm on the dose of amiodarone  amiodarone -induced hypothyroidism chronic anticoagulation and cardiomyopathy mildly reduced ejection fraction 40 to 45% last seen 04/30/2023.  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 He lives a sedated quiet life but is not having cardiovascular symptoms of edema shortness of breath chest pain palpitation or syncope Labs through the [facility] I told him his cholesterol LDL was elevated 138 no follow-up on treatment after discussion with the patient and he wants lipid-lowering therapy and I will start a low-dose of a statin pravastatin follow-up labs in the [facility]. He tolerates his amiodarone  and has not had any breakthrough episodes of atrial fibrillation he tells me his thyroid  medication is now regulated through the [facility] He had an appointment canceled will not be rescheduled for 6 months I am going to check labs again including thyroid  function Past Medical History:  Diagnosis Date   BPH (benign prostatic hyperplasia)    Cardiomyopathy, unspecified (HCC) 06/12/2023   Encounter for fitting and adjustment of hearing aid 04/26/2023   Eosinophil count raised 12/13/2020   Fractured dental restorative material with loss of material 04/26/2023   Hypothyroidism, unspecified 04/26/2023   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   Cholecalciferol 50  MCG (2000 UT) TABS Take 1 tablet by mouth daily.   fluticasone (FLONASE) 50 MCG/ACT nasal spray Place 2 sprays into both nostrils daily.   levothyroxine  (SYNTHROID ) 75 MCG tablet Take 1 tablet (75 mcg total) by mouth daily before breakfast.   Rivaroxaban  (XARELTO ) 15 MG TABS tablet Take 1 tablet (15 mg  total) by mouth daily with supper.   sacubitril-valsartan  (ENTRESTO ) 24-26 MG Take 1 tablet by mouth 2 (two) times daily.   [DISCONTINUED] pravastatin (PRAVACHOL) 20 MG tablet Take 1 tablet (20 mg total) by mouth daily.      EKGs/Labs/Other Studies Reviewed:    The following studies were reviewed today:  Cardiac Studies & Procedures   ______________________________________________________________________________________________     ECHOCARDIOGRAM  ECHOCARDIOGRAM COMPLETE 12/29/2021  Narrative ECHOCARDIOGRAM REPORT    Patient Name:   Kenneth Bradley Date of Exam: 12/29/2021 Medical Rec #:  [id number]        Height:       76.0 in Accession #:    [id number]       Weight:       178.0 lb Date of Birth:  1930-09-29        BSA:          2.108 m Patient Age:    89 years         BP:           140/80 mmHg Patient Gender: M                HR:           52 bpm. Exam Location:  [facility]  Procedure: 2D Echo, Cardiac Doppler, Color Doppler and Strain Analysis  Indications:    PAF (paroxysmal atrial fibrillation) (HCC) [I48.0 (ICD-10-CM)]; On amiodarone  therapy [Z79.899 (ICD-10-CM)]; Chronic anticoagulation [Z79.01 (ICD-10-CM)]; Other cardiomyopathy (HCC) [I42.8 (ICD-10-CM)]; Acquired hypothyroidism [E03.9 (ICD-10-CM)]  History:        Patient has prior history of Echocardiogram examinations, most recent 11/30/2020.  Sonographer:    Kenneth Bradley Referring Phys: [id number] Kenneth Bradley Kenneth Bradley  IMPRESSIONS   1. Left ventricular ejection fraction, by estimation, is 60 to 65%. The left ventricle has normal function. The left ventricle has no regional wall motion abnormalities. The left ventricular internal cavity size was mildly dilated. There is mild left ventricular hypertrophy. Left ventricular diastolic parameters are consistent with Grade I diastolic dysfunction (impaired relaxation). 2. Right ventricular systolic function is normal. The right ventricular size is normal. There is mildly  elevated pulmonary artery systolic pressure. 3. Left atrial size was severely dilated. 4. Right atrial size was severely dilated. 5. The mitral valve is normal in structure. No evidence of mitral valve regurgitation. No evidence of mitral stenosis. 6. The aortic valve is normal in structure. There is mild calcification of the aortic valve. There is mild thickening of the aortic valve. Aortic valve regurgitation is trivial. Mild aortic valve stenosis. Aortic valve area, by VTI measures 1.63 cm. Aortic valve mean gradient measures 11.0 mmHg. 7. Aneurysm of the ascending aorta, measuring 41 mm. 8. The inferior vena cava is normal in size with greater than 50% respiratory variability, suggesting right atrial pressure of 3 mmHg.  FINDINGS Left Ventricle: Left ventricular ejection fraction, by estimation, is 60 to 65%. The left ventricle has normal function. The left ventricle has no regional wall motion abnormalities. The left ventricular internal cavity size was mildly dilated. There is mild left ventricular hypertrophy. Left ventricular diastolic parameters are consistent with Grade I diastolic dysfunction (impaired relaxation).  Right Ventricle: The right ventricular size is normal. No increase in right ventricular wall thickness. Right ventricular systolic function is normal. There is mildly elevated pulmonary artery systolic pressure. The tricuspid regurgitant velocity is 2.69 m/s, and with an assumed right atrial pressure of 8 mmHg, the estimated right ventricular systolic pressure is 36.9 mmHg.  Left Atrium: Left atrial size was severely dilated.  Right Atrium: Right atrial size was severely dilated.  Pericardium: There is no evidence of pericardial effusion.  Mitral Valve: The mitral valve is normal in structure. No evidence of mitral valve regurgitation. No evidence of mitral valve stenosis.  Tricuspid Valve: The tricuspid valve is normal in structure. Tricuspid valve regurgitation is  trivial. No evidence of tricuspid stenosis.  Aortic Valve: The aortic valve is normal in structure. There is mild calcification of the aortic valve. There is mild thickening of the aortic valve. Aortic valve regurgitation is trivial. Mild aortic stenosis is present. Aortic valve mean gradient measures 11.0 mmHg. Aortic valve peak gradient measures 19.4 mmHg. Aortic valve area, by VTI measures 1.63 cm.  Pulmonic Valve: The pulmonic valve was normal in structure. Pulmonic valve regurgitation is mild. No evidence of pulmonic stenosis.  Aorta: The aortic root is normal in size and structure. There is an aneurysm involving the ascending aorta measuring 41 mm.  Venous: The inferior vena cava is normal in size with greater than 50% respiratory variability, suggesting right atrial pressure of 3 mmHg.  IAS/Shunts: No atrial level shunt detected by color flow Doppler.   LEFT VENTRICLE PLAX 2D LVIDd:         6.30 cm   Diastology LVIDs:         4.30 cm   LV e' medial:    5.22 cm/s LV PW:         1.20 cm   LV E/e' medial:  10.9 LV IVS:        1.20 cm   LV e' lateral:   3.73 cm/s LVOT diam:     2.10 cm   LV E/e' lateral: 15.2 LV SV:         85 LV SV Index:   40 LVOT Area:     3.46 cm   RIGHT VENTRICLE             IVC RV S prime:     12.60 cm/s  IVC diam: 2.30 cm TAPSE (M-mode): 2.5 cm  LEFT ATRIUM              Index        RIGHT ATRIUM           Index LA diam:        5.20 cm  2.47 cm/m   RA Area:     32.90 cm LA Vol (A2C):   96.0 ml  45.54 ml/m  RA Volume:   116.00 ml 55.02 ml/m LA Vol (A4C):   106.0 ml 50.28 ml/m LA Biplane Vol: 101.0 ml 47.91 ml/m AORTIC VALVE                     PULMONIC VALVE AV Area (Vmax):    1.40 cm      PR End Diast Vel: 2.92 msec AV Area (Vmean):   1.33 cm AV Area (VTI):     1.63 cm AV Vmax:           220.50 cm/s AV Vmean:          157.000 cm/s AV VTI:  0.520 m AV Peak Grad:      19.4 mmHg AV Mean Grad:      11.0 mmHg LVOT Vmax:          89.40 cm/s LVOT Vmean:        60.400 cm/s LVOT VTI:          0.244 m LVOT/AV VTI ratio: 0.47  AORTA Ao Root diam:  3.70 cm Ao Sinus diam: 3.80 cm Ao STJ diam:   3.6 cm Ao Asc diam:   4.05 cm Ao Desc diam:  2.40 cm  MITRAL VALVE                  TRICUSPID VALVE MV Area (PHT): 3.16 cm       TR Peak grad:   28.9 mmHg MV Decel Time: 240 msec       TR Vmax:        269.00 cm/s MR Peak grad:    125.4 mmHg MR Mean grad:    85.0 mmHg    SHUNTS MR Vmax:         560.00 cm/s  Systemic VTI:  0.24 m MR Vmean:        435.0 cm/s   Systemic Diam: 2.10 cm MR PISA:         4.02 cm MR PISA Eff ROA: 25 mm MR PISA Radius:  0.80 cm MV E velocity: 56.80 cm/s MV A velocity: 60.60 cm/s MV E/A ratio:  0.94  Lamar Fitch MD Electronically signed by Lamar Fitch MD Signature Date/Time: 12/29/2021/6:00:00 PM    Final    MONITORS  LONG TERM MONITOR-LIVE TELEMETRY (3-14 DAYS) 05/23/2023  Narrative Patch Wear Time:  13 days and 14 hours (2024-11-25T17:16:25-0500 to 2024-12-09T07:40:25-0500)  Patient had a min HR of 38 bpm, max HR of 124 bpm, and avg HR of 52 bpm. Predominant underlying rhythm was Sinus Rhythm.  There were 18 triggered events all sinus rhythm 1 had a single PVC.  There were no sinus pauses of 3 seconds or greater and no episodes of second or third-degree AV block.  There were no episodes of atrial fibrillation or flutter.  3 brief APC runs occurred, the run with the fastest interval lasting 6 beats with a max rate of 124 bpm (avg 108 bpm); the run with the fastest interval was also the longest.  Isolated SVEs were rare (<1.0%), SVE Couplets were rare (<1.0%), and SVE Triplets were rare (<1.0%).  Isolated VEs were rare (<1.0%), and no VE Couplets or VE Triplets were present.       ______________________________________________________________________________________________      EKG Interpretation Date/Time:  Wednesday April 16 2024 09:54:53 EST Ventricular  Rate:  53 PR Interval:  162 QRS Duration:  108 QT Interval:  496 QTC Calculation: 465 R Axis:   35  Text Interpretation: Sinus bradycardia Incomplete right bundle branch block When compared with ECG of 30-Apr-2023 16:21, No significant change was found Confirmed by Monetta Rogue ([id number]) on 04/16/2024 10:06:24 AM   Recent Labs: 05/02/2023: ALT 16; BUN 20; Creatinine, Ser 1.34; Hemoglobin 12.7; Platelets 259; Potassium 5.0; Sodium 140 05/10/2023: TSH 3.240    Physical Exam:    VS:  BP (!) 150/82   Pulse (!) 53   Ht 6' 4 (1.93 m)   Wt 185 lb 6.4 oz (84.1 kg)   SpO2 99%   BMI 22.57 kg/m     Wt Readings from Last 3 Encounters:  04/16/24 185 lb 6.4 oz (84.1 kg)  06/14/23 186 lb 9.6  oz (84.6 kg)  04/30/23 184 lb 9.6 oz (83.7 kg)     GEN: He looks younger than his age well nourished, well developed in no acute distress HEENT: Normal NECK: No JVD; No carotid bruits LYMPHATICS: No lymphadenopathy CARDIAC: RRR, no murmurs, rubs, gallops RESPIRATORY:  Clear to auscultation without rales, wheezing or rhonchi  ABDOMEN: Soft, non-tender, non-distended MUSCULOSKELETAL:  No edema; No deformity  SKIN: Warm and dry NEUROLOGIC:  Alert and oriented x 3 PSYCHIATRIC:  Normal affect    Signed, Kenneth Leiter, MD  04/16/2024 10:39 AM    [facility]

## 2024-04-16 ENCOUNTER — Encounter: Payer: Self-pay | Admitting: Cardiology

## 2024-04-16 ENCOUNTER — Ambulatory Visit: Attending: Cardiology | Admitting: Cardiology

## 2024-04-16 VITALS — BP 150/82 | HR 53 | Ht 76.0 in | Wt 185.4 lb

## 2024-04-16 DIAGNOSIS — I480 Paroxysmal atrial fibrillation: Secondary | ICD-10-CM | POA: Insufficient documentation

## 2024-04-16 DIAGNOSIS — I519 Heart disease, unspecified: Secondary | ICD-10-CM | POA: Diagnosis not present

## 2024-04-16 DIAGNOSIS — E039 Hypothyroidism, unspecified: Secondary | ICD-10-CM | POA: Diagnosis not present

## 2024-04-16 DIAGNOSIS — Z7901 Long term (current) use of anticoagulants: Secondary | ICD-10-CM | POA: Diagnosis not present

## 2024-04-16 DIAGNOSIS — Z79899 Other long term (current) drug therapy: Secondary | ICD-10-CM | POA: Insufficient documentation

## 2024-04-16 MED ORDER — PRAVASTATIN SODIUM 20 MG PO TABS: 20.0000 mg | ORAL_TABLET | Freq: Every day | ORAL | 3 refills | Status: DC

## 2024-04-16 MED ORDER — PRAVASTATIN SODIUM 20 MG PO TABS: 20.0000 mg | ORAL_TABLET | Freq: Every evening | ORAL | 3 refills | Status: AC

## 2024-04-16 NOTE — Patient Instructions (Signed)
 Medication Instructions:  Your physician has recommended you make the following change in your medication:   START: Pravastatin 20 mg daily  *If you need a refill on your cardiac medications before your next appointment, please call your pharmacy*  Lab Work: Your physician recommends that you return for lab work in:   Labs today: CMP, TSH T3 T4  If you have labs (blood work) drawn today and your tests are completely normal, you will receive your results only by: MyChart Message (if you have MyChart) OR A paper copy in the mail If you have any lab test that is abnormal or we need to change your treatment, we will call you to review the results.  Testing/Procedures: None  Follow-Up: At [facility], you and your health needs are our priority.  As part of our continuing mission to provide you with exceptional heart care, our providers are all part of one team.  This team includes your primary Cardiologist (physician) and Advanced Practice Providers or APPs (Physician Assistants and Nurse Practitioners) who all work together to provide you with the care you need, when you need it.  Your next appointment:   9 month(s)  Provider:   Redell Leiter, MD    We recommend signing up for the patient portal called MyChart.  Sign up information is provided on this After Visit Summary.  MyChart is used to connect with patients for Virtual Visits (Telemedicine).  Patients are able to view lab/test results, encounter notes, upcoming appointments, etc.  Non-urgent messages can be sent to your provider as well.   To learn more about what you can do with MyChart, go to [email or web address].   Other Instructions None

## 2024-04-17 ENCOUNTER — Ambulatory Visit: Payer: Self-pay | Admitting: Cardiology

## 2024-04-17 LAB — COMPREHENSIVE METABOLIC PANEL WITH GFR
ALT: 13 IU/L (ref 0–44)
AST: 20 IU/L (ref 0–40)
Albumin: 4.2 g/dL (ref 3.6–4.6)
Alkaline Phosphatase: 69 IU/L (ref 48–129)
BUN/Creatinine Ratio: 14 (ref 10–24)
BUN: 18 mg/dL (ref 10–36)
Bilirubin Total: 0.6 mg/dL (ref 0.0–1.2)
CO2: 23 mmol/L (ref 20–29)
Calcium: 8.9 mg/dL (ref 8.6–10.2)
Chloride: 101 mmol/L (ref 96–106)
Creatinine, Ser: 1.26 mg/dL (ref 0.76–1.27)
Globulin, Total: 2.5 g/dL (ref 1.5–4.5)
Glucose: 90 mg/dL (ref 70–99)
Potassium: 4.8 mmol/L (ref 3.5–5.2)
Sodium: 138 mmol/L (ref 134–144)
Total Protein: 6.7 g/dL (ref 6.0–8.5)
eGFR: 54 mL/min/1.73 — ABNORMAL LOW (ref >59)

## 2024-04-17 LAB — TSH+T4F+T3FREE
Free T4: 1.63 ng/dL (ref 0.82–1.77)
T3, Free: 1.7 pg/mL — ABNORMAL LOW (ref 2.0–4.4)
TSH: 2.43 u[IU]/mL (ref 0.450–4.500)
